# Patient Record
Sex: Female | Born: 1970 | Race: Black or African American | Hispanic: No | Marital: Single | Smoking: Current every day smoker
Health system: Southern US, Community
[De-identification: ages and names within clinical notes are randomized; demographics above are authoritative.]

## PROBLEM LIST (undated history)

## (undated) ENCOUNTER — Inpatient Hospital Stay (HOSPITAL_COMMUNITY): Admission: EM | Payer: PRIVATE HEALTH INSURANCE | Source: Home / Self Care

## (undated) DIAGNOSIS — D649 Anemia, unspecified: Secondary | ICD-10-CM

## (undated) DIAGNOSIS — S0590XA Unspecified injury of unspecified eye and orbit, initial encounter: Secondary | ICD-10-CM

## (undated) DIAGNOSIS — R519 Headache, unspecified: Secondary | ICD-10-CM

## (undated) DIAGNOSIS — Z8742 Personal history of other diseases of the female genital tract: Secondary | ICD-10-CM

## (undated) DIAGNOSIS — D219 Benign neoplasm of connective and other soft tissue, unspecified: Secondary | ICD-10-CM

## (undated) DIAGNOSIS — R51 Headache: Secondary | ICD-10-CM

## (undated) HISTORY — PX: WISDOM TOOTH EXTRACTION: SHX21

---

## 2002-05-29 ENCOUNTER — Emergency Department (HOSPITAL_COMMUNITY): Admission: EM | Admit: 2002-05-29 | Discharge: 2002-05-29 | Payer: Self-pay | Admitting: Emergency Medicine

## 2002-05-31 ENCOUNTER — Encounter: Payer: Self-pay | Admitting: General Practice

## 2002-05-31 ENCOUNTER — Encounter: Admission: RE | Admit: 2002-05-31 | Discharge: 2002-05-31 | Payer: Self-pay | Admitting: General Practice

## 2002-06-06 ENCOUNTER — Encounter: Admission: RE | Admit: 2002-06-06 | Discharge: 2002-08-15 | Payer: Self-pay | Admitting: General Practice

## 2008-04-22 ENCOUNTER — Emergency Department (HOSPITAL_COMMUNITY): Admission: EM | Admit: 2008-04-22 | Discharge: 2008-04-22 | Payer: Self-pay | Admitting: Emergency Medicine

## 2008-04-24 ENCOUNTER — Emergency Department (HOSPITAL_COMMUNITY): Admission: EM | Admit: 2008-04-24 | Discharge: 2008-04-24 | Payer: Self-pay | Admitting: Family Medicine

## 2008-05-03 ENCOUNTER — Emergency Department (HOSPITAL_COMMUNITY): Admission: EM | Admit: 2008-05-03 | Discharge: 2008-05-03 | Payer: Self-pay | Admitting: Family Medicine

## 2008-07-16 ENCOUNTER — Inpatient Hospital Stay (HOSPITAL_COMMUNITY): Admission: EM | Admit: 2008-07-16 | Discharge: 2008-07-19 | Payer: Self-pay | Admitting: Emergency Medicine

## 2008-07-17 ENCOUNTER — Encounter (INDEPENDENT_AMBULATORY_CARE_PROVIDER_SITE_OTHER): Payer: Self-pay | Admitting: Neurology

## 2008-08-23 ENCOUNTER — Ambulatory Visit: Payer: Self-pay | Admitting: Internal Medicine

## 2008-11-14 ENCOUNTER — Encounter: Admission: RE | Admit: 2008-11-14 | Discharge: 2009-02-12 | Payer: Self-pay | Admitting: Internal Medicine

## 2009-01-02 ENCOUNTER — Emergency Department (HOSPITAL_COMMUNITY): Admission: EM | Admit: 2009-01-02 | Discharge: 2009-01-02 | Payer: Self-pay | Admitting: Emergency Medicine

## 2009-01-03 ENCOUNTER — Encounter: Admission: RE | Admit: 2009-01-03 | Discharge: 2009-01-03 | Payer: Self-pay | Admitting: Chiropractic Medicine

## 2009-02-09 ENCOUNTER — Emergency Department (HOSPITAL_COMMUNITY): Admission: EM | Admit: 2009-02-09 | Discharge: 2009-02-09 | Payer: Self-pay | Admitting: Emergency Medicine

## 2009-11-14 ENCOUNTER — Emergency Department (HOSPITAL_COMMUNITY): Admission: EM | Admit: 2009-11-14 | Discharge: 2009-11-14 | Payer: Self-pay | Admitting: Family Medicine

## 2010-01-10 ENCOUNTER — Emergency Department (HOSPITAL_COMMUNITY): Admission: EM | Admit: 2010-01-10 | Discharge: 2010-01-10 | Payer: Self-pay | Admitting: Family Medicine

## 2010-01-10 ENCOUNTER — Emergency Department (HOSPITAL_COMMUNITY): Admission: EM | Admit: 2010-01-10 | Discharge: 2010-01-11 | Payer: Self-pay | Admitting: Emergency Medicine

## 2010-04-16 ENCOUNTER — Other Ambulatory Visit
Admission: RE | Admit: 2010-04-16 | Discharge: 2010-04-16 | Payer: Self-pay | Source: Home / Self Care | Admitting: Obstetrics and Gynecology

## 2010-04-16 ENCOUNTER — Ambulatory Visit: Payer: Self-pay | Admitting: Obstetrics and Gynecology

## 2010-04-16 LAB — CONVERTED CEMR LAB
MCV: 63.5 fL — ABNORMAL LOW (ref 78.0–100.0)
Platelets: 1009 10*3/uL — ABNORMAL HIGH (ref 150–400)
TSH: 0.308 microintl units/mL — ABNORMAL LOW (ref 0.350–4.500)
WBC: 6.3 10*3/uL (ref 4.0–10.5)

## 2010-04-28 ENCOUNTER — Ambulatory Visit: Payer: Self-pay | Admitting: Obstetrics & Gynecology

## 2010-04-28 ENCOUNTER — Encounter: Payer: Self-pay | Admitting: Obstetrics & Gynecology

## 2010-04-28 LAB — CONVERTED CEMR LAB: T3, Free: 3.3 pg/mL (ref 2.3–4.2)

## 2010-05-07 ENCOUNTER — Ambulatory Visit: Payer: Self-pay | Admitting: Obstetrics and Gynecology

## 2010-05-21 ENCOUNTER — Ambulatory Visit: Payer: Self-pay | Admitting: Obstetrics and Gynecology

## 2010-07-10 ENCOUNTER — Ambulatory Visit: Payer: Self-pay | Admitting: Family Medicine

## 2010-07-16 ENCOUNTER — Other Ambulatory Visit: Payer: Self-pay | Admitting: Family Medicine

## 2010-07-16 ENCOUNTER — Ambulatory Visit: Payer: PRIVATE HEALTH INSURANCE | Admitting: Obstetrics and Gynecology

## 2010-07-16 ENCOUNTER — Encounter: Payer: Self-pay | Admitting: Obstetrics and Gynecology

## 2010-07-16 DIAGNOSIS — N938 Other specified abnormal uterine and vaginal bleeding: Secondary | ICD-10-CM

## 2010-07-16 DIAGNOSIS — N949 Unspecified condition associated with female genital organs and menstrual cycle: Secondary | ICD-10-CM

## 2010-07-16 DIAGNOSIS — Z1231 Encounter for screening mammogram for malignant neoplasm of breast: Secondary | ICD-10-CM

## 2010-07-16 LAB — CONVERTED CEMR LAB
MCV: 60.1 fL — ABNORMAL LOW (ref 78.0–100.0)
Platelets: 492 10*3/uL — ABNORMAL HIGH (ref 150–400)
WBC: 4.1 10*3/uL (ref 4.0–10.5)

## 2010-07-23 ENCOUNTER — Ambulatory Visit (HOSPITAL_COMMUNITY)
Admission: RE | Admit: 2010-07-23 | Discharge: 2010-07-23 | Disposition: A | Discharge status: Home / Self Care | Payer: PRIVATE HEALTH INSURANCE | Source: Ambulatory Visit | Attending: Family Medicine | Admitting: Family Medicine

## 2010-07-23 ENCOUNTER — Other Ambulatory Visit: Payer: Self-pay | Admitting: Family Medicine

## 2010-07-23 DIAGNOSIS — Z1231 Encounter for screening mammogram for malignant neoplasm of breast: Secondary | ICD-10-CM | POA: Insufficient documentation

## 2010-07-23 DIAGNOSIS — J40 Bronchitis, not specified as acute or chronic: Secondary | ICD-10-CM

## 2010-07-23 DIAGNOSIS — I517 Cardiomegaly: Secondary | ICD-10-CM | POA: Insufficient documentation

## 2010-07-23 DIAGNOSIS — R059 Cough, unspecified: Secondary | ICD-10-CM | POA: Insufficient documentation

## 2010-07-23 DIAGNOSIS — J209 Acute bronchitis, unspecified: Secondary | ICD-10-CM | POA: Insufficient documentation

## 2010-07-23 DIAGNOSIS — R05 Cough: Secondary | ICD-10-CM | POA: Insufficient documentation

## 2010-07-23 DIAGNOSIS — F17200 Nicotine dependence, unspecified, uncomplicated: Secondary | ICD-10-CM | POA: Insufficient documentation

## 2010-07-30 ENCOUNTER — Ambulatory Visit: Payer: PRIVATE HEALTH INSURANCE | Admitting: Obstetrics and Gynecology

## 2010-07-30 ENCOUNTER — Encounter: Payer: Self-pay | Admitting: Obstetrics and Gynecology

## 2010-07-30 DIAGNOSIS — N949 Unspecified condition associated with female genital organs and menstrual cycle: Secondary | ICD-10-CM

## 2010-07-30 LAB — CONVERTED CEMR LAB
HCT: 21.5 % — ABNORMAL LOW (ref 36.0–46.0)
MCHC: 26.5 g/dL — ABNORMAL LOW (ref 30.0–36.0)
MCV: 59.7 fL — ABNORMAL LOW (ref 78.0–100.0)
Platelets: 527 10*3/uL — ABNORMAL HIGH (ref 150–400)
RDW: 25.7 % — ABNORMAL HIGH (ref 11.5–15.5)

## 2010-08-02 ENCOUNTER — Observation Stay (HOSPITAL_COMMUNITY)
Admission: AD | Admit: 2010-08-02 | Discharge: 2010-08-03 | Disposition: A | Discharge status: Home / Self Care | Payer: PRIVATE HEALTH INSURANCE | Source: Ambulatory Visit | Attending: Family Medicine | Admitting: Family Medicine

## 2010-08-02 DIAGNOSIS — D62 Acute posthemorrhagic anemia: Principal | ICD-10-CM | POA: Insufficient documentation

## 2010-08-02 DIAGNOSIS — D259 Leiomyoma of uterus, unspecified: Secondary | ICD-10-CM | POA: Insufficient documentation

## 2010-08-02 DIAGNOSIS — N920 Excessive and frequent menstruation with regular cycle: Secondary | ICD-10-CM | POA: Insufficient documentation

## 2010-08-03 LAB — CBC
HCT: 29.2 % — ABNORMAL LOW (ref 36.0–46.0)
Hemoglobin: 8.5 g/dL — ABNORMAL LOW (ref 12.0–15.0)
MCH: 19.3 pg — ABNORMAL LOW (ref 26.0–34.0)
MCV: 66.2 fL — ABNORMAL LOW (ref 78.0–100.0)
RBC: 4.41 MIL/uL (ref 3.87–5.11)

## 2010-08-04 LAB — CROSSMATCH
ABO/RH(D): B POS
Unit division: 0

## 2010-08-14 ENCOUNTER — Encounter: Payer: Self-pay | Admitting: Obstetrics and Gynecology

## 2010-08-14 ENCOUNTER — Ambulatory Visit: Payer: PRIVATE HEALTH INSURANCE | Admitting: Obstetrics and Gynecology

## 2010-08-14 DIAGNOSIS — N949 Unspecified condition associated with female genital organs and menstrual cycle: Secondary | ICD-10-CM

## 2010-08-14 DIAGNOSIS — D649 Anemia, unspecified: Secondary | ICD-10-CM

## 2010-08-14 LAB — CONVERTED CEMR LAB
HCT: 31.7 % — ABNORMAL LOW (ref 36.0–46.0)
Hemoglobin: 9.2 g/dL — ABNORMAL LOW (ref 12.0–15.0)
MCHC: 29 g/dL — ABNORMAL LOW (ref 30.0–36.0)
MCV: 66.6 fL — ABNORMAL LOW (ref 78.0–100.0)
RBC: 4.76 M/uL (ref 3.87–5.11)
WBC: 4.8 10*3/uL (ref 4.0–10.5)

## 2010-08-15 ENCOUNTER — Ambulatory Visit: Payer: PRIVATE HEALTH INSURANCE | Admitting: Obstetrics and Gynecology

## 2010-08-15 DIAGNOSIS — N949 Unspecified condition associated with female genital organs and menstrual cycle: Secondary | ICD-10-CM

## 2010-08-15 DIAGNOSIS — D509 Iron deficiency anemia, unspecified: Secondary | ICD-10-CM

## 2010-08-15 DIAGNOSIS — D259 Leiomyoma of uterus, unspecified: Secondary | ICD-10-CM

## 2010-08-15 LAB — CBC
HCT: 26.6 % — ABNORMAL LOW (ref 36.0–46.0)
Hemoglobin: 7.5 g/dL — ABNORMAL LOW (ref 12.0–15.0)
RDW: 24.6 % — ABNORMAL HIGH (ref 11.5–15.5)
WBC: 3.9 10*3/uL — ABNORMAL LOW (ref 4.0–10.5)

## 2010-08-15 LAB — DIFFERENTIAL
Basophils Relative: 0 % (ref 0–1)
Eosinophils Absolute: 0.1 10*3/uL (ref 0.0–0.7)
Eosinophils Relative: 3 % (ref 0–5)
Lymphocytes Relative: 42 % (ref 12–46)
Neutrophils Relative %: 44 % (ref 43–77)

## 2010-08-15 LAB — POCT URINALYSIS DIPSTICK
Nitrite: NEGATIVE
Protein, ur: 100 mg/dL — AB
pH: 5 (ref 5.0–8.0)

## 2010-08-15 LAB — WET PREP, GENITAL: Clue Cells Wet Prep HPF POC: NONE SEEN

## 2010-08-17 LAB — WET PREP, GENITAL
Clue Cells Wet Prep HPF POC: NONE SEEN
Trich, Wet Prep: NONE SEEN
WBC, Wet Prep HPF POC: NONE SEEN
Yeast Wet Prep HPF POC: NONE SEEN

## 2010-08-17 LAB — GC/CHLAMYDIA PROBE AMP, GENITAL
Chlamydia, DNA Probe: NEGATIVE
GC Probe Amp, Genital: NEGATIVE

## 2010-08-17 LAB — POCT PREGNANCY, URINE: Preg Test, Ur: NEGATIVE

## 2010-08-21 ENCOUNTER — Other Ambulatory Visit: Payer: Self-pay | Admitting: Obstetrics and Gynecology

## 2010-08-21 DIAGNOSIS — D219 Benign neoplasm of connective and other soft tissue, unspecified: Secondary | ICD-10-CM

## 2010-08-22 NOTE — Progress Notes (Unsigned)
NAME:  [name], [name] NO.:  [id number]  MEDICAL RECORD NO.:  [id number]           PATIENT TYPE:  A  LOCATION:  [facility]                   FACILITY:  [facility]  PHYSICIAN:  Argentina Donovan, MD        DATE OF BIRTH:  Oct 07, 1970  DATE OF SERVICE:  08/14/2010                                 CLINIC NOTE  The patient is a 40 year old African American nulligravida female, who was seen in November with a hemoglobin of 5.4, hematocrit of 20, placed on Depo-Provera cut down somewhat on her bleeding, but still she continued to bleed.  I saw her then a few weeks ago.  On February 29, her hemoglobin at that time was still only 5.7, so we had her admitted and gave her 2 units of blood which brought her hemoglobin up at 8.5. She has uterine fibroids.  She was not really anxious for surgery.  We have talked about endometrial ablation as well as myomectomy and I am going to have her first see an interventional radiologist prior to seeing one of our surgeons.  She also has a problem with headaches, especially on the left side and nasal congestion on that side.  The headaches have a character of migraine with the trigger points in the neck.  However, with the constant nasal congestion on the left, it certainly cannot rule out any ENT problem that may be aggravating this condition, so once she sees an interventional radiologist, comes in for a surgical consult, then I think that they referred to an ENT specialist.  She had been on Plavix in the past, but now stopped that because of an allergy.  IMPRESSION:  Dysfunctional uterine bleeding secondary to leiomyomata uteri.  PLAN:  Uterine artery embolization versus endometrial ablation versus myomectomy.          ______________________________ Argentina Donovan, MD    PR/MEDQ  D:  08/14/2010  T:  08/15/2010  Job:  [id number]

## 2010-08-22 NOTE — Progress Notes (Unsigned)
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  She was seen in November with hemoglobin of 5.4, hematocrit of 20, and dysfunctional uterine bleeding.  We started on Depo-Provera, which controlled the bleeding after benign endometrial biopsy.  She returns today, the bleeding has restarted and give her another shot. Get a CBC on her.  She has decreased breath sounds and a productive cough for week and she has some rhonchi in the upper part of the chest and decreased breath sounds.  I am putting her on Medrol Dosepak with Keflex.  Bring her back in a week and see whether or not the bleeding is controlled and hopefully, the chest is clear.  IMPRESSION:  Probably acute bronchitis to rule out pneumonia; dysfunctional uterine bleeding secondary to submucous fibroid.          ______________________________ Argentina Donovan, MD    PR/MEDQ  D:  07/16/2010  T:  07/17/2010  Job:  [id number]

## 2010-08-22 NOTE — Progress Notes (Signed)
NAME:  [name], [name] NO.:  [id number]  MEDICAL RECORD NO.:  [id number]           PATIENT TYPE:  A  LOCATION:  [facility]                   FACILITY:  [facility]  PHYSICIAN:  Argentina Donovan, MD        DATE OF BIRTH:  07-04-1970  DATE OF SERVICE:  07/30/2010                                 CLINIC NOTE  The patient is a 40 year old nulligravida African American female who was seen originally for dysfunctional uterine bleeding, has several large fibroids, largest being around 5 cm and she bled down to a hemoglobin of 5.4.  When we saw her last time, she had been on Plavix because couple of years ago she did have a CVA that started causing skin problems, so her doctor stopped that immediately.  The Depo-Provera seems to have worked since the bleeding has stopped.  She had a hemoglobin of 5.4, last time we saw her it was 5.8.  It has been now 2 weeks since we saw her.  We are going to check her hemoglobin one more time.  If it has not gone up sufficiently in the short time, I think that we got to decide what to do, myomectomy following transfusion or endometrial ablation.  I know that she has had no children, so this is not something she would really like to do, but with a history of CVA, not now being on an anticoagulant, is concerning I think.  The impression is dysfunctional uterine bleeding with uterine fibroids and severe anemia.  I am going to bring her back in a month if she still stays amenorrhea and give her a second shot of Depo-Provera.  I am afraid to wait full 3 months and then give [place] a chance to recover the hemoglobin naturally and that way, hopefully, we can make some decision about treatment.  IMPRESSION:  Uterine fibroids with secondary dysfunctional uterine bleeding and severe anemia.          ______________________________ Argentina Donovan, MD    PR/MEDQ  D:  07/30/2010  T:  07/31/2010  Job:  [id number]

## 2010-08-27 ENCOUNTER — Other Ambulatory Visit: Payer: Self-pay | Admitting: Obstetrics and Gynecology

## 2010-08-27 ENCOUNTER — Ambulatory Visit
Admission: RE | Admit: 2010-08-27 | Discharge: 2010-08-27 | Disposition: A | Discharge status: Home / Self Care | Payer: PRIVATE HEALTH INSURANCE | Source: Ambulatory Visit | Attending: Obstetrics and Gynecology | Admitting: Obstetrics and Gynecology

## 2010-08-27 VITALS — BP 140/86 | HR 66 | Temp 99.0°F | Resp 16 | Ht 63.5 in | Wt 170.0 lb

## 2010-08-27 DIAGNOSIS — D219 Benign neoplasm of connective and other soft tissue, unspecified: Secondary | ICD-10-CM

## 2010-08-27 NOTE — Progress Notes (Signed)
Gravida 0 Pt reports that she has been experiencing menstrual type bleeding since 12/2009.  Prior to Rx  With Depo Provera also experienced heavy menstrual flow w/ clots & dysmenorrhea.  Bulk Sx:  Bloating; pelvic pressure; urinary frequency; back discomfort.    Anemia:  Hgb 5.4, Nov 2011;  Hgb 5.7, Jul 30, 2010.  Pt was admitted as IP for 2 units bld.  Hgb inc'd to 8.5.  Last checked, 08/14/2010:  Hgb 9.2.    Pt states that she has experienced a low grade fever since bld transfusion on 07/30/2010.

## 2010-08-28 ENCOUNTER — Ambulatory Visit
Admission: RE | Admit: 2010-08-28 | Discharge: 2010-08-28 | Disposition: A | Discharge status: Home / Self Care | Payer: PRIVATE HEALTH INSURANCE | Source: Ambulatory Visit | Attending: Obstetrics and Gynecology | Admitting: Obstetrics and Gynecology

## 2010-08-28 DIAGNOSIS — D219 Benign neoplasm of connective and other soft tissue, unspecified: Secondary | ICD-10-CM

## 2010-09-04 ENCOUNTER — Other Ambulatory Visit (HOSPITAL_COMMUNITY): Payer: Self-pay | Admitting: Interventional Radiology

## 2010-09-05 LAB — CULTURE, ROUTINE-ABSCESS

## 2010-09-16 LAB — FOLATE: Folate: 17.5 ng/mL

## 2010-09-16 LAB — COMPREHENSIVE METABOLIC PANEL
AST: 18 U/L (ref 0–37)
Albumin: 3.2 g/dL — ABNORMAL LOW (ref 3.5–5.2)
Alkaline Phosphatase: 52 U/L (ref 39–117)
BUN: 9 mg/dL (ref 6–23)
CO2: 23 mEq/L (ref 19–32)
Chloride: 107 mEq/L (ref 96–112)
GFR calc Af Amer: >60 mL/min (ref >60)
GFR calc non Af Amer: >60 mL/min (ref >60)
Potassium: 4.1 mEq/L (ref 3.5–5.1)
Total Bilirubin: 0.3 mg/dL (ref 0.3–1.2)

## 2010-09-16 LAB — URINALYSIS, ROUTINE W REFLEX MICROSCOPIC
Bilirubin Urine: NEGATIVE
Glucose, UA: NEGATIVE mg/dL
Hgb urine dipstick: NEGATIVE
Ketones, ur: NEGATIVE mg/dL
Protein, ur: NEGATIVE mg/dL
Urobilinogen, UA: 0.2 mg/dL (ref 0.0–1.0)

## 2010-09-16 LAB — LIPID PANEL
Cholesterol: 116 mg/dL (ref 0–200)
HDL: 39 mg/dL — ABNORMAL LOW (ref >39)
LDL Cholesterol: 68 mg/dL (ref 0–99)
Total CHOL/HDL Ratio: 3 RATIO
Triglycerides: 47 mg/dL (ref <150)

## 2010-09-16 LAB — CBC
HCT: 27.1 % — ABNORMAL LOW (ref 36.0–46.0)
MCHC: 30.5 g/dL (ref 30.0–36.0)
MCHC: 30.8 g/dL (ref 30.0–36.0)
MCV: 60.4 fL — ABNORMAL LOW (ref 78.0–100.0)
MCV: 62.8 fL — ABNORMAL LOW (ref 78.0–100.0)
Platelets: 413 10*3/uL — ABNORMAL HIGH (ref 150–400)
Platelets: 293 10*3/uL (ref 150–400)
Platelets: 314 10*3/uL (ref 150–400)
Platelets: 362 10*3/uL (ref 150–400)
RBC: 4.42 MIL/uL (ref 3.87–5.11)
RDW: 26.9 % — ABNORMAL HIGH (ref 11.5–15.5)
WBC: 8.1 10*3/uL (ref 4.0–10.5)
WBC: 6.4 10*3/uL (ref 4.0–10.5)

## 2010-09-16 LAB — GLUCOSE, CAPILLARY: Glucose-Capillary: 92 mg/dL (ref 70–99)

## 2010-09-16 LAB — PROTIME-INR: INR: 1.2 (ref 0.00–1.49)

## 2010-09-16 LAB — DIFFERENTIAL
Basophils Relative: 2 % — ABNORMAL HIGH (ref 0–1)
Eosinophils Absolute: 0.1 10*3/uL (ref 0.0–0.7)
Monocytes Absolute: 0.6 10*3/uL (ref 0.1–1.0)
Neutro Abs: 4.9 10*3/uL (ref 1.7–7.7)

## 2010-09-16 LAB — HEPARIN LEVEL (UNFRACTIONATED)
Heparin Unfractionated: 0.31 IU/mL (ref 0.30–0.70)
Heparin Unfractionated: 0.76 IU/mL — ABNORMAL HIGH (ref 0.30–0.70)
Heparin Unfractionated: 0.79 IU/mL — ABNORMAL HIGH (ref 0.30–0.70)

## 2010-09-16 LAB — COMPLEMENT, TOTAL: Compl, Total (CH50): 56 U/mL (ref 31–66)

## 2010-09-16 LAB — VITAMIN B12: Vitamin B-12: 571 pg/mL (ref 211–911)

## 2010-09-16 LAB — C4 COMPLEMENT: Complement C4, Body Fluid: 23 mg/dL (ref 16–47)

## 2010-09-16 LAB — URINE MICROSCOPIC-ADD ON

## 2010-09-16 LAB — SEDIMENTATION RATE: Sed Rate: 15 mm/hr (ref 0–22)

## 2010-09-18 NOTE — Discharge Summary (Signed)
  NAMEJOLYNDA, [name]              ACCOUNT NO.:  [id number]  MEDICAL RECORD NO.:  [id number]           PATIENT TYPE:  LOCATION:                                 FACILITY:  PHYSICIAN:  Catalina Antigua, MD     DATE OF BIRTH:  [birth year]  DATE OF ADMISSION: DATE OF DISCHARGE:                              DISCHARGE SUMMARY   This is a 40 year old gravida 0 with known fibroid and menorrhagia who was admitted on March 3 secondary to symptomatic anemia. The patient has a known history of fibroid and menorrhagia, currently medically managed with Depo-Provera.  The patient was admitted on March 3 secondary to a hemoglobin of 5.4.  The patient's vital signs were otherwise stable and blood pressure was of 112/74, pulse of 78.  However the patient looked pale and had a 2/6 systolic ejection murmur.  Physical examination.  She had an approximately 8-week size retroverted uterus.  Plan was for admission for blood transfusion.  The patient received 4 units of packed red blood cells.  Following the completion of the transfusion, her hemoglobin was found to be 8.5.  The patient reported feeling significantly better.  The patient was deemed to be stable for discharge with plans for followup at the [facility].  The patient was advised to continue the use of iron.     Catalina Antigua, MD     PC/MEDQ  D:  09/16/2010  T:  09/16/2010  Job:  [id number]  Electronically Signed by Catalina Antigua  on 09/18/2010 02:37:46 PM

## 2010-10-14 NOTE — Consult Note (Signed)
[name], [name] NO.:  [id number]   MEDICAL RECORD NO.:  [id number]          PATIENT TYPE:  INP   LOCATION:  1826                         FACILITY:  [facility]   PHYSICIAN:  Noel Christmas, MD    DATE OF BIRTH:  Jun 17, 1970   DATE OF CONSULTATION:  07/16/2008  DATE OF DISCHARGE:                                 CONSULTATION   REFERRING PHYSICIAN:  Bethann Berkshire, MD.   REASON FOR CONSULTATION:  Blurred vision involving left eye as well as  headache for 5 to 6 days.   HISTORY OF PRESENT ILLNESS:  This is a 41 year old lady who presented  with a history of blurring of vision involving her left eye along with  headache involving left side of her head, left side of her neck, and  left shoulder for 5 to 6 days.  With no improvement in vision, she  decided to seek medical attention.  Initial evaluation showed markedly  reduced visual acuity involving her left eye.  There has been no real  change since the onset of her symptoms.  Symptoms started during a spell  of coughing.  She has had some numbness in both feet, but no other focal  neurological symptoms.  CT of her head without contrast showed no acute  intracranial abnormality.  The patient has no known systematic medical  disease.  Sedimentation rate was 12.  Serum glucose was 92.  Blood  pressure was 127/75.  The patient has been on no medications.  She was  seen by ophthalmologist prior to my evaluation.  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  Etiology is unclear.   PAST MEDICAL HISTORY:  As indicated is negative for systematic medical  disease.  There is a history of cannabis use.  The patient has been a  cigarette smoker at times.   MEDICATIONS:  None.   FAMILY HISTORY:  Positive for stroke and diabetes mellitus involving her  mother.  Family history is otherwise noncontributory.   PHYSICAL EXAMINATION:  VITAL SIGNS:  Temperature was 98.1, pulse was 59  per  minute and regular, respirations were 14 per minute, and blood  pressure as indicated above was 127/75.  GENERAL:  The patient was alert and oriented x3.  Her short-term and  long-term memory were normal.  Affect was appropriate.  She had no  receptive, no expressive language problem.  HEENT:  Pupils were completely dilated from previous ophthalmological  exam.  Extraocular movements were full and conjugate.  Visual fields  were intact and normal.  Right fundus appeared normal.  Left fundus  showed mild paleness of her optic nerve as well as slight blurring of  optic disk edge.  There was no evidence of retinal artery branch  occlusion nor obvious reduction in caliber of retinal arteries.  There  was no venous distention and the patient had no retinal hemorrhages.  There was no facial numbness, no facial weakness.  Hearing was normal.  Speech and palatal movement were normal.  Strength and muscle tone were  normal throughout.  Deep tendon reflexes were normal  and symmetrical.  Plantar responses were flexor.  Sensory examination was normal.  NECK:  The patient had no carotid bruits on either side.   CLINICAL IMPRESSION:  Acute ischemic event involving left eye secondary  to retinal artery insufficiency, occlusion of possible spasm.  Etiology  is unclear.  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  However, embolus source will need  to be ruled out, either carotid or cardiac.  Vasculitis is less likely  given sedimentation rate of 12.  Vasculitis cannot be completely ruled  out at this point, however.  Coagulopathy will also need to be ruled  out.   RECOMMENDATIONS:  1. PT and PTT if not already ordered.  2. CT angiogram of the head and neck with contrast.  3. The patient may will need catheter cerebral angiogram as well.  4. TEE to rule out embolus source.  5. Workup to rule out collagen vascular disease/vasculitis.  6. Workup to  rule out coagulopathy.  7. Aspirin 325 mg per day unless CT angiogram shows indication of      significant occlusive disease or carotid artery dissection, which      case the patient will need anticoagulation.  8. Analgesic as needed for headache management as well as muscle      relaxants such as Robaxin 500 mg IV q.6 h. p.r.n.      Noel Christmas, MD  Electronically Signed     CS/MEDQ  D:  07/16/2008  T:  07/16/2008  Job:  [id number]

## 2010-10-14 NOTE — H&P (Signed)
[name], [name] NO.:  [id number]   MEDICAL RECORD NO.:  [id number]          PATIENT TYPE:  INP   LOCATION:                               FACILITY:  [facility]   PHYSICIAN:  Noel Christmas, MD    DATE OF BIRTH:  01-14-1971   DATE OF ADMISSION:  07/16/2008  DATE OF DISCHARGE:                              HISTORY & PHYSICAL   CHIEF COMPLAINT:  Blurred vision for 5 to 6 days involving the left eye.   HISTORY OF PRESENT ILLNESS:  This is a 40 year old African American lady  who presented with a history of acute onset of blurred vision 5 to 6  days earlier which occurred during a spell of coughing.  She also  experienced the onset of headache at the same time, involving the left  side of her head.  She states she has also developed pain on the left  side of her neck as well as pain on the left shoulder.  Pain has been  excruciating at times and she has had some intermittent nausea.  She did  not seek medical attention until tonight.  She has no known medical  illness and has been on no medications.  Ophthalmology consultant  evaluated her earlier and determined that she had central retinal artery  ischemic changes of unclear etiology.  Carotid dissection was  consideration, given her history of onset of symptoms with exertion.  The patient had no other focal symptoms other than tingling involving  her feet.  CT scan of her head showed no acute intracranial abnormality.  Sedimentation rate was 12.  Serum glucose was 92, and the patient has no  history of diabetes mellitus.   PAST MEDICAL HISTORY:  Fairly unremarkable.  She has no known systemic  medical disease.  There is some history of cannabis use and cigarette  smoking.   MEDICATIONS:  None.   ALLERGIES:  None.   FAMILY HISTORY:  Positive for stroke and diabetes mellitus involving her  mother.  Otherwise, family history is noncontributory.   SOCIAL HISTORY:  The patient is divorced and lives with fiance.   She has  no children.  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  She does not drink alcohol.  She  has had some history of cigarette smoking.  As indicated above, there is  some history of cannabis use as well.   REVIEW OF SYSTEMS:  Negative except for presenting illness.   PHYSICAL EXAMINATION:  VITAL SIGNS:  Temperature was 98.1, pulse 59 per  minute, respirations 14 per minute, blood pressure was 127/75, and  oxygen saturation on room air was 99%.  GENERAL:  Appearance was that of slightly overweight young to early-  middle-aged lady who was alert and cooperative, and in no acute  distress.  She was well oriented to time as well as place.  Short-term  and long-term memory were normal.  Affect was appropriate.  HEAD, EYES, EARS, NOSE, and THROAT:  Normal except both pupils were  completely dilated from previous ophthalmology evaluation  and neither  reacted to light.  NECK:  Supple except for mild tenderness involving the left side of her  neck.  CHEST:  Clear to auscultation.  CARDIAC:  Rate and rhythm were normal.  Heart sounds were normal.  ABDOMEN:  Soft and flat.  Bowel sounds were normal.  There were no  masses or tenderness.  GENITALIA:  Deferred.  RECTUM:  Deferred.  EXTREMITIES:  Normal.  Peripheral pulses were normal.  NEUROLOGIC:  Pupils were dilated and unreacted to light.  Extraocular  movements were full and conjugate.  Visual fields were intact and  normal.  Right fundus appeared normal.  Left fundus showed paleness of  the optic nerve as well as mild blurring of the disk edge on the left.  There was no arterial narrowing nor any signs of retinal artery branch  occlusion.  There was no venous distention and there were also no  retinal hemorrhages.  She had no facial numbness.  No facial weakness.  Hearing was normal.  Speech and palatal movements were normal.  Motor  exam showed normal strength and normal  tone throughout.  Coordination  was normal.  Deep tendon reflexes were normal and symmetrical  throughout.  Plantar responses were flexor.  Sensory examination was  normal.  The patient had no carotid bruit on either side.   CT angiogram showed occlusion of the left internal carotid artery,  consistent with probable carotid artery dissection, consistent with  probable dissection, given her history as described above.  Circle of  Willis and cerebral vasculature were normal.   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  The patient has no  clinical signs nor any indications on CTA and CT of her head a  cerebrovascular ischemic event.   PLAN:  1. We will complete workup with obtaining PT and PTT as well as CBC      and CMP.  2. The patient will likely need anticoagulation with heparin initially      and long-term anticoagulation with Coumadin.  3. We will have Vascular Surgery evaluate the patient's images and to      determine if intervention is indicated.  4. Symptomatic management of headache with morphine sulfate 2 mg every      3 hours as well as Robaxin 500 mg every 6 hours p.r.n. headache or      neck pain.  5. Workup to rule out underlying coagulopathy.      Noel Christmas, MD  Electronically Signed     CS/MEDQ  D:  07/16/2008  T:  07/16/2008  Job:  [id number]

## 2010-10-14 NOTE — Discharge Summary (Signed)
[name], [name]              ACCOUNT NO.:  [id number]   MEDICAL RECORD NO.:  [id number]          PATIENT TYPE:  INP   LOCATION:  2030                         FACILITY:  [facility]   PHYSICIAN:  Pramod P. 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   DISCHARGE MEDICATIONS:  1. Aspirin 81 mg a day.  2. Plavix 75 mg a day.   STUDIES PERFORMED:  1. CT of the brain on admission shows no acute abnormality.  2. Chest x-ray shows heart size upper limits of normal.  3. CT angio of the head normal.  4. CT angio of the neck shows occlusion of the left internal carotid      artery with intraluminal filling defect noted, likely due to      dissection.  5. Cerebral angiogram shows occluded left internal carotid artery at      the bulb without distal reconstitution of the intracranial internal      carotid artery from the external carotid artery branches, partial      attempt at reconstitution of the ipsilateral ophthalmic artery from      the nasoethmoidal branches with a partial ciliary brush,      nonopacification of the left ACA A1 segment.  6. EKG shows normal sinus rhythm.   LABORATORY STUDIES:  PT 1.1 and INR 1.4 on July 18, 2008 after  Coumadin.  Folic acid 17.5.  Homocystine 8.7.  C4 23 and C3 102.  Sed  rate 12.  Vitamin B12 571.  Hemoglobin A1c 5.8.  Cholesterol 116,  triglycerides 47, HDL 39, and LDL 68.   HISTORY OF PRESENT ILLNESS:  [name] is a 40 year old right-  handed African American female who presents with history of acute onset  of blurred vision for 5-6 days which occurred during the spell of  coughing.  She also experienced a headache at that time involving the  left side of her head.  She states she has also developed  pain into the  left side of her neck as well as pain in her left shoulder.  Pain has  been excruciating at times and she has had some intermittent nausea.  She did not seek medical attention until the night of July 16, 2008.  She has no known significant medical history.  Ophthalmology had been  consulted earlier in the evening and determined she has central retinal  artery occlusion of unclear etiology.  Carotid dissection was considered  given her history of acute onset of symptoms with exertion.  She had no  other focal symptoms.  Sed rate was 12 and glucose was 92.  CT head  showed no acute abnormalities.  She was admitted to the hospital for  workup.  She was placed on IV heparin and Coumadin.  We will manage her  symptoms of headache.   HOSPITAL COURSE:  Subacute left ICA occlusion  secondary to dissection  from cough, placed on IV heparin and Coumadin initially, but cannot  maintain Coumadin as she has no primary care physician and no one to  follow up lab values and adjust Coumadin dose.  We will place on aspirin  and Plavix until for the next 2 months.  We will plan CT angio in 2  months to reassess dissection and make plans from there.  In the  meanwhile, we will get her establish with [facility] as her primary  care physician.  Plavix has been applied forth of the medicine  assistance program and that will be shipped to Dr. Delia Heady for the  next 2 months.  The patient has no neurologic deficit other than  decreased visual field from her central retinal artery occlusion and she  is safe for discharge home.  She will follow up with Dr. Pearlean Brownie in 2  months.   CONDITION ON DISCHARGE:  The patient is alert and oriented x3.  No  aphasia.  No dysarthria.  Eye movements are full.  Face is symmetric.  No extremity weakness.  She has decreased visual field in her left eye.   DISCHARGE PLAN:  1. Discharge home.  2. Aspirin and Plavix x2 months secondary to dissection.  3.  Follow up [facility] and get established there to a primary care      physician.  4. Follow up Dr. Delia Heady in 2 months.  5. Individual medicine assistance program for Plavix will be sent to      Dr. Delia Heady.      Annie Main, N.P.    ______________________________  Sunny Schlein. Pearlean Brownie, MD    SB/MEDQ  D:  07/19/2008  T:  07/19/2008  Job:  [id number]   cc:   [facility]

## 2011-03-03 LAB — CULTURE, ROUTINE-ABSCESS

## 2015-01-11 ENCOUNTER — Inpatient Hospital Stay (HOSPITAL_COMMUNITY): Payer: PRIVATE HEALTH INSURANCE

## 2015-01-11 ENCOUNTER — Inpatient Hospital Stay (HOSPITAL_COMMUNITY)
Admission: AD | Admit: 2015-01-11 | Discharge: 2015-01-11 | Disposition: A | Discharge status: Home / Self Care | Payer: PRIVATE HEALTH INSURANCE | Source: Ambulatory Visit | Attending: Family Medicine | Admitting: Family Medicine

## 2015-01-11 ENCOUNTER — Encounter (HOSPITAL_COMMUNITY): Payer: Self-pay | Admitting: *Deleted

## 2015-01-11 DIAGNOSIS — A5901 Trichomonal vulvovaginitis: Secondary | ICD-10-CM

## 2015-01-11 DIAGNOSIS — D509 Iron deficiency anemia, unspecified: Secondary | ICD-10-CM

## 2015-01-11 DIAGNOSIS — F17210 Nicotine dependence, cigarettes, uncomplicated: Secondary | ICD-10-CM | POA: Insufficient documentation

## 2015-01-11 DIAGNOSIS — R109 Unspecified abdominal pain: Secondary | ICD-10-CM

## 2015-01-11 DIAGNOSIS — D250 Submucous leiomyoma of uterus: Secondary | ICD-10-CM

## 2015-01-11 DIAGNOSIS — N939 Abnormal uterine and vaginal bleeding, unspecified: Secondary | ICD-10-CM

## 2015-01-11 DIAGNOSIS — D649 Anemia, unspecified: Secondary | ICD-10-CM | POA: Insufficient documentation

## 2015-01-11 HISTORY — DX: Unspecified injury of unspecified eye and orbit, initial encounter: S05.90XA

## 2015-01-11 HISTORY — DX: Personal history of other diseases of the female genital tract: Z87.42

## 2015-01-11 HISTORY — DX: Headache, unspecified: R51.9

## 2015-01-11 HISTORY — DX: Headache: R51

## 2015-01-11 HISTORY — DX: Anemia, unspecified: D64.9

## 2015-01-11 LAB — CBC WITH DIFFERENTIAL/PLATELET
Basophils Absolute: 0 10*3/uL (ref 0.0–0.1)
Basophils Relative: 1 % (ref 0–1)
EOS PCT: 2 % (ref 0–5)
Eosinophils Absolute: 0.1 10*3/uL (ref 0.0–0.7)
HEMATOCRIT: 26.8 % — AB (ref 36.0–46.0)
HEMOGLOBIN: 7.7 g/dL — AB (ref 12.0–15.0)
LYMPHS ABS: 1.2 10*3/uL (ref 0.7–4.0)
LYMPHS PCT: 27 % (ref 12–46)
MCH: 17.1 pg — ABNORMAL LOW (ref 26.0–34.0)
MCHC: 28.7 g/dL — ABNORMAL LOW (ref 30.0–36.0)
MCV: 59.7 fL — AB (ref 78.0–100.0)
MONO ABS: 0.5 10*3/uL (ref 0.1–1.0)
MONOS PCT: 11 % (ref 3–12)
NEUTROS ABS: 2.6 10*3/uL (ref 1.7–7.7)
Neutrophils Relative %: 60 % (ref 43–77)
Platelets: 413 10*3/uL — ABNORMAL HIGH (ref 150–400)
RBC: 4.49 MIL/uL (ref 3.87–5.11)
RDW: 22.2 % — AB (ref 11.5–15.5)
WBC: 4.4 10*3/uL (ref 4.0–10.5)

## 2015-01-11 LAB — URINALYSIS, ROUTINE W REFLEX MICROSCOPIC
BILIRUBIN URINE: NEGATIVE
Glucose, UA: NEGATIVE mg/dL
KETONES UR: NEGATIVE mg/dL
Leukocytes, UA: NEGATIVE
NITRITE: NEGATIVE
PH: 6 (ref 5.0–8.0)
PROTEIN: 100 mg/dL — AB
Specific Gravity, Urine: >1.03 — ABNORMAL HIGH (ref 1.005–1.030)
UROBILINOGEN UA: 0.2 mg/dL (ref 0.0–1.0)

## 2015-01-11 LAB — URINE MICROSCOPIC-ADD ON

## 2015-01-11 LAB — WET PREP, GENITAL
CLUE CELLS WET PREP: NONE SEEN
YEAST WET PREP: NONE SEEN

## 2015-01-11 MED ORDER — MEGESTROL ACETATE 40 MG PO TABS: 40.0000 mg | ORAL_TABLET | Freq: Every day | ORAL | Status: DC

## 2015-01-11 MED ORDER — METRONIDAZOLE 500 MG PO TABS
2000.0000 mg | ORAL_TABLET | Freq: Once | ORAL | Status: AC
  Administered 2015-01-11: 2000 mg via ORAL
  Filled 2015-01-11: qty 4

## 2015-01-11 NOTE — MAU Provider Note (Signed)
History     CSN: [id number]  Arrival date and time: 01/11/15 1203   None     Chief Complaint  Patient presents with  . Vaginal Bleeding   HPI [name] is 44 y.o. G0P0000 Unknown weeks presenting with heavy vaginal bleeding.  Reports she has been bleeding for 22 days, soaking through her clothes at work .  Hx of anemia.  She bought multivitamins and FE yesterday.  She has associated sxs of lighteadness, feeling weak, feeling hot all the time.  sexually active with 1 partner, last intercourse 1 week ago.  She would like a pregnancy.    Past Medical History  Diagnosis Date  . H/O menorrhagia   . Ocular injury   . Headache   . Anemia     History reviewed. No pertinent past surgical history.  Family History  Problem Relation Age of Onset  . Diabetes Mother   . Cancer Mother   . Cancer Maternal Aunt   . Diabetes Maternal Grandmother   . Cancer Maternal Grandfather     Social History  Substance Use Topics  . Smoking status: Current Every Day Smoker -- 0.50 packs/day    Types: Cigarettes  . Smokeless tobacco: Never Used  . Alcohol Use: No    Allergies:  Allergies  Allergen Reactions  . Plavix [Clopidogrel Bisulfate] Rash    Prescriptions prior to admission  Medication Sig Dispense Refill Last Dose  . acetaminophen (TYLENOL) 325 MG tablet Take 650 mg by mouth every 6 (six) hours as needed.   01/11/2015 at Unknown time  . aspirin 81 MG tablet Take 81 mg by mouth daily.   01/10/2015 at Unknown time  . IRON PO Take 1 tablet by mouth daily.   01/10/2015 at Unknown time    Review of Systems  Constitutional: Negative for fever and chills.  Respiratory: Negative for shortness of breath.   Cardiovascular: Negative for chest pain.  Gastrointestinal: Positive for nausea, vomiting, abdominal pain and constipation. Negative for diarrhea.  Genitourinary: Positive for frequency. Negative for dysuria, urgency and hematuria.       Neg for vaginal discharge or odor. Lengthy  heavy bleeding with clots.   Neurological: Positive for weakness and headaches.   Physical Exam   Blood pressure 143/80, pulse 116, temperature 98.1 F (36.7 C), temperature source Oral, resp. rate 18.  Physical Exam  Constitutional: She is oriented to person, place, and time. She appears well-developed and well-nourished.  HENT:  Head: Normocephalic.  Neck: Normal range of motion. Neck supple.  Cardiovascular: Normal rate, regular rhythm and normal heart sounds.   Respiratory: Effort normal and breath sounds normal. No respiratory distress.  GI: Soft. There is tenderness (right sided moderate tenderness).  Genitourinary: There is no rash, tenderness or lesion on the right labia. There is no rash, tenderness or lesion on the left labia. Uterus is enlarged and tender. Cervix exhibits no motion tenderness, no discharge and no friability. Right adnexum displays tenderness. Right adnexum displays no mass and no fullness. Left adnexum displays no mass, no tenderness and no fullness. There is bleeding (scant this blood, neg for clot or active bleeding) in the vagina. No vaginal discharge found.  Musculoskeletal: Normal range of motion. She exhibits no edema.  Neurological: She is alert and oriented to person, place, and time.  Skin: Skin is warm and dry.  Psychiatric: She has a normal mood and affect.    Results for orders placed or performed during the hospital encounter of 01/11/15 (from the  past 24 hour(s))  Urinalysis, Routine w reflex microscopic (not at [facility])     Status: Abnormal   Collection Time: 01/11/15 12:20 PM  Result Value Ref Range   Color, Urine YELLOW YELLOW   APPearance CLEAR CLEAR   Specific Gravity, Urine >1.030 (H) 1.005 - 1.030   pH 6.0 5.0 - 8.0   Glucose, UA NEGATIVE NEGATIVE mg/dL   Hgb urine dipstick TRACE (A) NEGATIVE   Bilirubin Urine NEGATIVE NEGATIVE   Ketones, ur NEGATIVE NEGATIVE mg/dL   Protein, ur 100 (A) NEGATIVE mg/dL   Urobilinogen, UA 0.2 0.0 - 1.0  mg/dL   Nitrite NEGATIVE NEGATIVE   Leukocytes, UA NEGATIVE NEGATIVE  Urine microscopic-add on     Status: Abnormal   Collection Time: 01/11/15 12:20 PM  Result Value Ref Range   Squamous Epithelial / LPF RARE RARE   WBC, UA 0-2 <3 WBC/hpf   RBC / HPF 0-2 <3 RBC/hpf   Bacteria, UA MANY (A) RARE   Urine-Other MUCOUS PRESENT   CBC with Differential/Platelet     Status: Abnormal   Collection Time: 01/11/15  1:38 PM  Result Value Ref Range   WBC 4.4 4.0 - 10.5 K/uL   RBC 4.49 3.87 - 5.11 MIL/uL   Hemoglobin 7.7 (L) 12.0 - 15.0 g/dL   HCT 26.8 (L) 36.0 - 46.0 %   MCV 59.7 (L) 78.0 - 100.0 fL   MCH 17.1 (L) 26.0 - 34.0 pg   MCHC 28.7 (L) 30.0 - 36.0 g/dL   RDW 22.2 (H) 11.5 - 15.5 %   Platelets 413 (H) 150 - 400 K/uL   Neutrophils Relative % 60 43 - 77 %   Neutro Abs 2.6 1.7 - 7.7 K/uL   Lymphocytes Relative 27 12 - 46 %   Lymphs Abs 1.2 0.7 - 4.0 K/uL   Monocytes Relative 11 3 - 12 %   Monocytes Absolute 0.5 0.1 - 1.0 K/uL   Eosinophils Relative 2 0 - 5 %   Eosinophils Absolute 0.1 0.0 - 0.7 K/uL   Basophils Relative 1 0 - 1 %   Basophils Absolute 0.0 0.0 - 0.1 K/uL  Wet prep, genital     Status: Abnormal   Collection Time: 01/11/15  1:50 PM  Result Value Ref Range   Yeast Wet Prep HPF POC NONE SEEN NONE SEEN   Trich, Wet Prep FEW (A) NONE SEEN   Clue Cells Wet Prep HPF POC NONE SEEN NONE SEEN   WBC, Wet Prep HPF POC FEW (A) NONE SEEN    MAU Course  Procedures   GC/CHL culture Results for orders placed or performed during the hospital encounter of 01/11/15 (from the past 24 hour(s))  Urinalysis, Routine w reflex microscopic (not at [facility])     Status: Abnormal   Collection Time: 01/11/15 12:20 PM  Result Value Ref Range   Color, Urine YELLOW YELLOW   APPearance CLEAR CLEAR   Specific Gravity, Urine >1.030 (H) 1.005 - 1.030   pH 6.0 5.0 - 8.0   Glucose, UA NEGATIVE NEGATIVE mg/dL   Hgb urine dipstick TRACE (A) NEGATIVE   Bilirubin Urine NEGATIVE NEGATIVE   Ketones, ur  NEGATIVE NEGATIVE mg/dL   Protein, ur 100 (A) NEGATIVE mg/dL   Urobilinogen, UA 0.2 0.0 - 1.0 mg/dL   Nitrite NEGATIVE NEGATIVE   Leukocytes, UA NEGATIVE NEGATIVE  Urine microscopic-add on     Status: Abnormal   Collection Time: 01/11/15 12:20 PM  Result Value Ref Range   Squamous Epithelial / LPF  RARE RARE   WBC, UA 0-2 <3 WBC/hpf   RBC / HPF 0-2 <3 RBC/hpf   Bacteria, UA MANY (A) RARE   Urine-Other MUCOUS PRESENT   CBC with Differential/Platelet     Status: Abnormal   Collection Time: 01/11/15  1:38 PM  Result Value Ref Range   WBC 4.4 4.0 - 10.5 K/uL   RBC 4.49 3.87 - 5.11 MIL/uL   Hemoglobin 7.7 (L) 12.0 - 15.0 g/dL   HCT 26.8 (L) 36.0 - 46.0 %   MCV 59.7 (L) 78.0 - 100.0 fL   MCH 17.1 (L) 26.0 - 34.0 pg   MCHC 28.7 (L) 30.0 - 36.0 g/dL   RDW 22.2 (H) 11.5 - 15.5 %   Platelets 413 (H) 150 - 400 K/uL   Neutrophils Relative % 60 43 - 77 %   Neutro Abs 2.6 1.7 - 7.7 K/uL   Lymphocytes Relative 27 12 - 46 %   Lymphs Abs 1.2 0.7 - 4.0 K/uL   Monocytes Relative 11 3 - 12 %   Monocytes Absolute 0.5 0.1 - 1.0 K/uL   Eosinophils Relative 2 0 - 5 %   Eosinophils Absolute 0.1 0.0 - 0.7 K/uL   Basophils Relative 1 0 - 1 %   Basophils Absolute 0.0 0.0 - 0.1 K/uL  Wet prep, genital     Status: Abnormal   Collection Time: 01/11/15  1:50 PM  Result Value Ref Range   Yeast Wet Prep HPF POC NONE SEEN NONE SEEN   Trich, Wet Prep FEW (A) NONE SEEN   Clue Cells Wet Prep HPF POC NONE SEEN NONE SEEN   WBC, Wet Prep HPF POC FEW (A) NONE SEEN   MDM MSE Labs Exam U/S US Transvaginal Non-ob  01/11/2015   CLINICAL DATA:  Abnormal uterine bleeding x23 days, anemia, history of submucosal fibroids  EXAM: TRANSABDOMINAL AND TRANSVAGINAL ULTRASOUND OF PELVIS  TECHNIQUE: Both transabdominal and transvaginal ultrasound examinations of the pelvis were performed. Transabdominal technique was performed for global imaging of the pelvis including uterus, ovaries, adnexal regions, and pelvic cul-de-sac.  It was necessary to proceed with endovaginal exam following the transabdominal exam to visualize the uterus and bilateral ovaries.  COMPARISON:  MR pelvis dated 08/28/2010  FINDINGS: Uterus  Measurements: 8.3 x 4.8 x 7.0 cm. Dominant 4.4 x 3.7 x 3.2 cm submucosal fibroid in the posterior uterine body. These are better visualized as two distinct fibroids on prior MRI.  Endometrium  Not discretely visualized/distorted by the dominant fibroid.  Right ovary  Measurements: 4.1 x 2.7 x 3.0 cm. 3.0 x 2.9 x 2.2 cm simple cyst/follicle.  Left ovary  Measurements: 3.6 x 1.9 x 2.4 cm. Normal appearance/no adnexal mass.  Other findings  No free fluid.  IMPRESSION: Dominant 4.4 cm submucosal fibroid in the posterior uterine body.   Electronically Signed   By: Julian Hy M.D.   On: 01/11/2015 17:08   US Pelvis Complete  01/11/2015   CLINICAL DATA:  Abnormal uterine bleeding x23 days, anemia, history of submucosal fibroids  EXAM: TRANSABDOMINAL AND TRANSVAGINAL ULTRASOUND OF PELVIS  TECHNIQUE: Both transabdominal and transvaginal ultrasound examinations of the pelvis were performed. Transabdominal technique was performed for global imaging of the pelvis including uterus, ovaries, adnexal regions, and pelvic cul-de-sac. It was necessary to proceed with endovaginal exam following the transabdominal exam to visualize the uterus and bilateral ovaries.  COMPARISON:  MR pelvis dated 08/28/2010  FINDINGS: Uterus  Measurements: 8.3 x 4.8 x 7.0 cm. Dominant 4.4 x 3.7 x  3.2 cm submucosal fibroid in the posterior uterine body. These are better visualized as two distinct fibroids on prior MRI.  Endometrium  Not discretely visualized/distorted by the dominant fibroid.  Right ovary  Measurements: 4.1 x 2.7 x 3.0 cm. 3.0 x 2.9 x 2.2 cm simple cyst/follicle.  Left ovary  Measurements: 3.6 x 1.9 x 2.4 cm. Normal appearance/no adnexal mass.  Other findings  No free fluid.  IMPRESSION: Dominant 4.4 cm submucosal fibroid in the posterior  uterine body.   Electronically Signed   By: Julian Hy M.D.   On: 01/11/2015 17:08  discussed ultrasound results with pt- pt states she has hx of fibroids and myomectomy had been discussed with pt- for some reason not able to be done Pt has hx of abnormal bleeding and anemia Discussed trichomonas vaginitis- will treat pt in MAU and partner will need to get treated Work note until Monday Pt to take Megace 40mg  BID until clinic appointment  16:00  Care turned over to S. Andres Labrum, NP Assessment and Plan  A:  Abdominal pain      Trichomonal infection  P: aub- megace 40mg  BID; note sent to clinic for appointment-  Trich- treated in MAU Anemia- MVT and iron supplement  West Pugh, NP Lesia Hausen M 01/11/2015, 2:43 PM

## 2015-01-11 NOTE — MAU Note (Addendum)
C/o heavy vaginal bleeding for past 22 days; hx of heavy vaginal bleeding and anemia; never been pregnant; not on any BC; c/o feeling weak,lightheaded, bleeding through her clothes at work and feeling HOT all the time;

## 2015-01-12 LAB — HIV ANTIBODY (ROUTINE TESTING W REFLEX): HIV SCREEN 4TH GENERATION: NONREACTIVE

## 2015-01-14 LAB — GC/CHLAMYDIA PROBE AMP (~~LOC~~) NOT AT ARMC
Chlamydia: NEGATIVE
NEISSERIA GONORRHEA: NEGATIVE

## 2015-01-24 ENCOUNTER — Ambulatory Visit (INDEPENDENT_AMBULATORY_CARE_PROVIDER_SITE_OTHER): Payer: PRIVATE HEALTH INSURANCE | Admitting: Obstetrics & Gynecology

## 2015-01-24 ENCOUNTER — Encounter: Payer: Self-pay | Admitting: Obstetrics & Gynecology

## 2015-01-24 ENCOUNTER — Other Ambulatory Visit (HOSPITAL_COMMUNITY): Payer: Self-pay | Admitting: Obstetrics & Gynecology

## 2015-01-24 VITALS — BP 109/88 | HR 92 | Temp 98.2°F | Ht 64.0 in | Wt 149.7 lb

## 2015-01-24 DIAGNOSIS — D250 Submucous leiomyoma of uterus: Secondary | ICD-10-CM

## 2015-01-24 DIAGNOSIS — D259 Leiomyoma of uterus, unspecified: Secondary | ICD-10-CM

## 2015-01-24 NOTE — Progress Notes (Signed)
   Subjective:    Patient ID: [name], female    DOB: 1971/01/29, 44 y.o.   MRN: [id number]  HPI  44 yo S AA G0 here with anemia, fibroids. She is currently on megace and her bleeding has stopped. She had a stroke in 2010.  Review of Systems     Objective:   Physical Exam  [id number] Conversing, breathing, and ambulating normally Add- benign      Assessment & Plan:  Fibroids, anemia, and h/o stroke- strongly recommend [place] or depo lupron/provera Recheck CBC in 4 weeks

## 2015-01-24 NOTE — Progress Notes (Signed)
[place] scheduled for Wednesday morning

## 2015-01-24 NOTE — Patient Instructions (Addendum)
Radiology Intervention to schedule [place] [phone]  Uterine Artery Embolization for Fibroids Uterine artery embolization is a nonsurgical treatment to shrink fibroids. A thin plastic tube (catheter) is used to inject material that blocks off the blood supply to the fibroid, which causes the fibroid to shrink. LET [facility] CARE PROVIDER KNOW ABOUT:  Any allergies you have.  All medicines you are taking, including vitamins, herbs, eye drops, creams, and over-the-counter medicines.  Previous problems you or members of your family have had with the use of anesthetics.  Any blood disorders you have.  Previous surgeries you have had.  Medical conditions you have. RISKS AND COMPLICATIONS  Injury to the uterus from decreased blood supply  Infection.  Blood infection (septicemia).  Lack of menstrual periods (amenorrhea).  Death of tissue cells (necrosis) around your bladder or vulva.  Development of a hole between organs or from an organ to the surface of your skin (fistula).  Blood clot in the legs (deep vein thrombosis) or lung (pulmonary embolus). BEFORE THE PROCEDURE  Ask your health care provider about changing or stopping your regular medicines.   Do not take aspirin or blood thinners (anticoagulants) for 1 week before the surgery or as directed by your health care provider.  Do not eat or drink anything for 8 hours before the surgery or as directed by your health care provider.   Empty your bladder before the procedure begins. PROCEDURE   An IV tube will be placed into one of your veins. This will be used to give you a sedative and pain medication (conscious sedation).  You will be given a medicine that numbs the area (local anesthetic).  A small cut will be made in your groin. A catheter is then inserted into the main artery of your leg.  The catheter will be guided through the artery to your uterus. A series of images will be taken while dye is injected through  the catheter in your groin. X-rays are taken at the same time. This is done to provide a road map of the blood supply to your uterus and fibroids.  Tiny plastic spheres, about the size of sand grains, will be injected through the catheter. Metal coils may be used to help block the artery. The particles will lodge in tiny branches of the uterine artery that supplies blood to the fibroids.  The procedure is repeated on the artery that supplies the other side of the uterus.  The catheter is then removed and pressure is held to stop any bleeding. No stitches are needed.  A dressing is then placed over the cut (incision). AFTER THE PROCEDURE  You will be taken to a recovery area where your progress will be monitored until you are awake, stable, and taking fluids well. If there are no other problems, you will then be moved to a regular hospital room.  You will be observed overnight in the hospital.  You will have cramping that should be controlled with pain medication. Document Released: 08/03/2005 Document Revised: 03/08/2013 Document Reviewed: 12/01/2012 [facility] Patient Information 2015 [place], [place]. This information is not intended to replace advice given to you by your health care provider. Make sure you discuss any questions you have with your health care provider.

## 2015-02-25 ENCOUNTER — Ambulatory Visit: Payer: PRIVATE HEALTH INSURANCE | Admitting: Obstetrics & Gynecology

## 2015-02-27 ENCOUNTER — Ambulatory Visit
Admission: RE | Admit: 2015-02-27 | Discharge: 2015-02-27 | Disposition: A | Discharge status: Home / Self Care | Payer: PRIVATE HEALTH INSURANCE | Source: Ambulatory Visit | Attending: Obstetrics & Gynecology | Admitting: Obstetrics & Gynecology

## 2015-02-27 ENCOUNTER — Other Ambulatory Visit: Payer: Self-pay | Admitting: General Practice

## 2015-02-27 DIAGNOSIS — D259 Leiomyoma of uterus, unspecified: Secondary | ICD-10-CM

## 2015-02-27 HISTORY — DX: Benign neoplasm of connective and other soft tissue, unspecified: D21.9

## 2015-02-27 NOTE — Consult Note (Signed)
Chief Complaint: Patient was seen in consultation today for  Chief Complaint  Patient presents with  . Follow-up    Consult for [place]     at the request of [place] C  Referring Physician(s): Dove,Myra C  History of Present Illness: [name] is a 44 y.o. female known to our service from a previous consultation 08/27/2010 for symptomatic uterine fibroids. She was interested in maintaining her fertility at that time and deferred invasive treatment. She now presents with progressive menorrhagia and dysmenorrhea. She remains G0 P0, with uncertain plans for future pregnancy. Her menstrual cycle is irregular with heavy flow and large clots fairly constantly requiring changing pads every 30 minutes and significant interperiod bleeding. Recent hemoglobin 7.7. She's taking iron and Megace. She does square describes some bulk symptoms including frequent urination and urinary frequency as well as abdominal pelvic pressure. She's had no previous fibroid surgery or other fibroid therapy. Ultrasound performed8/20/2016 confirmed persistent uterine fibroids.  Past Medical History  Diagnosis Date  . H/O menorrhagia   . Ocular injury   . Headache   . Anemia   . Fibroid     No past surgical history on file.  Allergies: Plavix  Medications: Prior to Admission medications   Medication Sig Start Date End Date Taking? Authorizing Provider  acetaminophen (TYLENOL) 325 MG tablet Take 650 mg by mouth every 6 (six) hours as needed.   Yes Historical Provider, MD  aspirin 81 MG tablet Take 81 mg by mouth daily.   Yes Historical Provider, MD  IRON PO Take 1 tablet by mouth daily.   Yes Historical Provider, MD  megestrol (MEGACE) 40 MG tablet Take 1 tablet (40 mg total) by mouth daily. 01/11/15  Yes West Pugh, NP  Multiple Vitamins-Minerals (WOMENS MULTIVITAMIN PLUS PO) Take 1 tablet by mouth daily.   Yes Historical Provider, MD     Family History  Problem Relation Age of Onset  .  Diabetes Mother   . Cancer Mother   . Cancer Maternal Aunt   . Diabetes Maternal Grandmother   . Cancer Maternal Grandfather     Social History   Social History  . Marital Status: Single    Spouse Name: N/A  . Number of Children: N/A  . Years of Education: N/A   Social History Main Topics  . Smoking status: Current Every Day Smoker -- 0.50 packs/day    Types: Cigarettes    Start date: 02/27/1995  . Smokeless tobacco: Never Used  . Alcohol Use: 0.0 oz/week    0 Standard drinks or equivalent per week     Comment: rarely on social occasions   . Drug Use: 2.00 per week     Comment: marijuana (last used 02/26/2015)    . Sexual Activity: Not on file   Other Topics Concern  . Not on file   Social History Narrative    ECOG Status: 2 - Symptomatic, <50% confined to bed  Review of Systems: A 12 point ROS discussed and pertinent positives are indicated in the HPI above.  All other systems are negative.  Review of Systems  Vital Signs: BP 144/80 mmHg  Pulse 72  Temp(Src) 98.4 F (36.9 C) (Oral)  Resp 14  Ht 5' 0.75" (1.543 m)  Wt 150 lb (68.04 kg)  BMI 28.58 kg/m2  SpO2 100%  LMP 01/10/2015 (Approximate)  Physical Exam  M[place]ati Score:     Imaging: No results found.  Labs:  CBC:  Recent Labs  01/11/15 1338  WBC 4.4  HGB 7.7*  HCT 26.8*  PLT 413*    COAGS: No results for input(s): INR, APTT in the last 8760 hours.  BMP: No results for input(s): NA, K, CL, CO2, GLUCOSE, BUN, CALCIUM, CREATININE, GFRNONAA, GFRAA in the last 8760 hours.  Invalid input(s): CMP  LIVER FUNCTION TESTS: No results for input(s): BILITOT, AST, ALT, ALKPHOS, PROT, ALBUMIN in the last 8760 hours.  TUMOR MARKERS: No results for input(s): AFPTM, CEA, CA199, CHROMGRNA in the last 8760 hours.  Assessment and Plan:  My impression is that this patient remains an appropriate candidate for consideration of fibroid treatment due to her significant abnormal uterine bleeding. We  discussed the pathophysiology of uterine fibroids. We discussed treatment options including conservative watchful waiting, hysterectomy, myomectomy, and fibroid embolization. We discussed that fibroid embolization does not represent fertility treatment. We discussed the technique of UFE, anticipated benefits, time course of symptom resolution, possible risks and complications including possible urgent hysterectomy. We discussed likelihood of clinical relief of symptoms 90%. She seemed to understand and did ask appropriate questions.  Since it has been 4 years since her initial presentation, I think it would be prudent to repeat the pelvic MR with contrast, ensure that she's developed no pedunculated subserosal or submucosal fibroids on a narrow stalk, as well as to exclude any interval pelvic pathology. We can set this up as an outpatient at her convenience. Assuming this looks okay, we can set up fibroid embolization at her convenience. She is motivated to proceed.  Thank you for this interesting consult.  I greatly enjoyed meeting NARIYAH OSIAS and look forward to participating in their care.  A copy of this report was sent to the requesting provider on this date.  Signed: HASSELL III, DAYNE DANIEL 02/27/2015, 3:41 PM   I spent a total of    25 Minutes in face to face in clinical consultation, greater than 50% of which was counseling/coordinating care for symptomatic uterine fibroids.

## 2015-03-08 ENCOUNTER — Ambulatory Visit: Payer: PRIVATE HEALTH INSURANCE | Admitting: Medical

## 2015-03-14 ENCOUNTER — Ambulatory Visit
Admission: RE | Admit: 2015-03-14 | Discharge: 2015-03-14 | Disposition: A | Discharge status: Home / Self Care | Payer: PRIVATE HEALTH INSURANCE | Source: Ambulatory Visit | Attending: Obstetrics & Gynecology | Admitting: Obstetrics & Gynecology

## 2015-03-14 DIAGNOSIS — D259 Leiomyoma of uterus, unspecified: Secondary | ICD-10-CM

## 2015-03-14 MED ORDER — GADOBENATE DIMEGLUMINE 529 MG/ML IV SOLN
14.0000 mL | Freq: Once | INTRAVENOUS | Status: DC | PRN
Start: 2015-03-14 — End: 2015-03-15

## 2015-03-25 ENCOUNTER — Telehealth: Payer: Self-pay | Admitting: *Deleted

## 2015-03-25 NOTE — Telephone Encounter (Signed)
I called Dr Alease Medina office and let them know Dr Vernard Gambles reviewed [name] report and she is not a candidate for UFE procedure. A solitary fibroid is intracavitary,she needs to see gynecologist about hysteroscopic resection of this.  I also called the patient and lmom that she is not a candidate for UFE and that someone from Dr Hulan Fray office would be contacting her.

## 2015-03-25 NOTE — Telephone Encounter (Signed)
Received a call from [facility] stating [name] was referred to them by [place] for a uterine fibroid embolization. Per Dr. Vernard Gambles who reviewed her MRI- he states she is not a candidate for uterine artery embolization. States she has a solitary fibroid and it is intracavitary . States needs to see her gynecologist for a hysteroscopic resection.  They will call patient and tell her she is not a candidate. Informed them I would forward message to Dr. Hulan Fray.

## 2015-04-23 ENCOUNTER — Telehealth: Payer: Self-pay | Admitting: *Deleted

## 2015-04-23 NOTE — Telephone Encounter (Signed)
Received a message from Dr. Hulan Fray asking [place] to forward to Dr. Roselie Awkward as he does hysteroscopic resection better.  Will forward to Dr Roselie Awkward.

## 2015-04-23 NOTE — Telephone Encounter (Signed)
Opened in error

## 2015-04-25 NOTE — Telephone Encounter (Signed)
I can see this patient to consider surgery

## 2015-04-29 NOTE — Telephone Encounter (Signed)
Called patient and informed her that it was recommended she follow up with one of our doctors for possible surgery since GI could not see her for surgery. Patient verbalized understanding and will await a call for appt. Patient had no questions

## 2015-05-16 ENCOUNTER — Ambulatory Visit: Payer: PRIVATE HEALTH INSURANCE | Admitting: Obstetrics & Gynecology

## 2017-03-09 ENCOUNTER — Inpatient Hospital Stay (HOSPITAL_COMMUNITY)
Admission: AD | Admit: 2017-03-09 | Discharge: 2017-03-09 | Disposition: A | Discharge status: Home / Self Care | Payer: Self-pay | Source: Ambulatory Visit | Attending: Obstetrics and Gynecology | Admitting: Obstetrics and Gynecology

## 2017-03-09 ENCOUNTER — Encounter (HOSPITAL_COMMUNITY): Payer: Self-pay | Admitting: *Deleted

## 2017-03-09 ENCOUNTER — Inpatient Hospital Stay (HOSPITAL_COMMUNITY): Payer: Self-pay

## 2017-03-09 DIAGNOSIS — D509 Iron deficiency anemia, unspecified: Secondary | ICD-10-CM | POA: Insufficient documentation

## 2017-03-09 DIAGNOSIS — R000 Tachycardia, unspecified: Secondary | ICD-10-CM | POA: Insufficient documentation

## 2017-03-09 DIAGNOSIS — Z7982 Long term (current) use of aspirin: Secondary | ICD-10-CM | POA: Insufficient documentation

## 2017-03-09 DIAGNOSIS — R1032 Left lower quadrant pain: Secondary | ICD-10-CM | POA: Insufficient documentation

## 2017-03-09 DIAGNOSIS — N200 Calculus of kidney: Secondary | ICD-10-CM | POA: Insufficient documentation

## 2017-03-09 DIAGNOSIS — F17210 Nicotine dependence, cigarettes, uncomplicated: Secondary | ICD-10-CM | POA: Insufficient documentation

## 2017-03-09 DIAGNOSIS — E0590 Thyrotoxicosis, unspecified without thyrotoxic crisis or storm: Secondary | ICD-10-CM | POA: Insufficient documentation

## 2017-03-09 DIAGNOSIS — R109 Unspecified abdominal pain: Secondary | ICD-10-CM

## 2017-03-09 DIAGNOSIS — Z3202 Encounter for pregnancy test, result negative: Secondary | ICD-10-CM | POA: Insufficient documentation

## 2017-03-09 LAB — URINALYSIS, ROUTINE W REFLEX MICROSCOPIC
Glucose, UA: 50 mg/dL — AB
Hgb urine dipstick: NEGATIVE
KETONES UR: NEGATIVE mg/dL
Nitrite: NEGATIVE
Protein, ur: 100 mg/dL — AB
SPECIFIC GRAVITY, URINE: 1.031 — AB (ref 1.005–1.030)
pH: 5 (ref 5.0–8.0)

## 2017-03-09 LAB — COMPREHENSIVE METABOLIC PANEL
ALBUMIN: 3.7 g/dL (ref 3.5–5.0)
ALK PHOS: 105 U/L (ref 38–126)
ALT: 22 U/L (ref 14–54)
AST: 27 U/L (ref 15–41)
Anion gap: 9 (ref 5–15)
BILIRUBIN TOTAL: 0.2 mg/dL — AB (ref 0.3–1.2)
BUN: 18 mg/dL (ref 6–20)
CALCIUM: 10.6 mg/dL — AB (ref 8.9–10.3)
CO2: 26 mmol/L (ref 22–32)
CREATININE: 0.52 mg/dL (ref 0.44–1.00)
Chloride: 99 mmol/L — ABNORMAL LOW (ref 101–111)
GFR calc Af Amer: >60 mL/min (ref >60)
GLUCOSE: 156 mg/dL — AB (ref 65–99)
POTASSIUM: 4.4 mmol/L (ref 3.5–5.1)
Sodium: 134 mmol/L — ABNORMAL LOW (ref 135–145)
TOTAL PROTEIN: 7.7 g/dL (ref 6.5–8.1)

## 2017-03-09 LAB — CBC WITH DIFFERENTIAL/PLATELET
BASOS ABS: 0 10*3/uL (ref 0.0–0.1)
Basophils Relative: 0 %
EOS ABS: 0.2 10*3/uL (ref 0.0–0.7)
Eosinophils Relative: 3 %
HEMATOCRIT: 43.5 % (ref 36.0–46.0)
HEMOGLOBIN: 13.8 g/dL (ref 12.0–15.0)
LYMPHS PCT: 47 %
Lymphs Abs: 2.7 10*3/uL (ref 0.7–4.0)
MCH: 21.4 pg — ABNORMAL LOW (ref 26.0–34.0)
MCHC: 31.7 g/dL (ref 30.0–36.0)
MCV: 67.4 fL — AB (ref 78.0–100.0)
MONOS PCT: 6 %
Monocytes Absolute: 0.4 10*3/uL (ref 0.1–1.0)
NEUTROS ABS: 2.6 10*3/uL (ref 1.7–7.7)
NEUTROS PCT: 44 %
Platelets: 316 10*3/uL (ref 150–400)
RBC: 6.45 MIL/uL — ABNORMAL HIGH (ref 3.87–5.11)
RDW: 18.1 % — AB (ref 11.5–15.5)
WBC: 5.9 10*3/uL (ref 4.0–10.5)

## 2017-03-09 LAB — T4, FREE: Free T4: >5.5 ng/dL — ABNORMAL HIGH (ref 0.61–1.12)

## 2017-03-09 LAB — TSH

## 2017-03-09 LAB — POCT PREGNANCY, URINE: PREG TEST UR: NEGATIVE

## 2017-03-09 MED ORDER — LACTATED RINGERS IV BOLUS (SEPSIS): 1000.0000 mL | Freq: Once | INTRAVENOUS | Status: DC

## 2017-03-09 MED ORDER — TAMSULOSIN HCL 0.4 MG PO CAPS: 0.4000 mg | ORAL_CAPSULE | Freq: Every day | ORAL | 0 refills | Status: DC

## 2017-03-09 MED ORDER — MORPHINE SULFATE (PF) 4 MG/ML IV SOLN
1.0000 mg | Freq: Once | INTRAVENOUS | Status: AC
  Administered 2017-03-09: 1 mg via INTRAVENOUS
  Filled 2017-03-09: qty 1

## 2017-03-09 MED ORDER — OXYCODONE-ACETAMINOPHEN 5-325 MG PO TABS: 1.0000 | ORAL_TABLET | Freq: Four times a day (QID) | ORAL | 0 refills | Status: DC | PRN

## 2017-03-09 MED ORDER — LACTATED RINGERS IV BOLUS (SEPSIS)
1000.0000 mL | Freq: Once | INTRAVENOUS | Status: AC
  Administered 2017-03-09: 1000 mL via INTRAVENOUS

## 2017-03-09 MED ORDER — METOPROLOL TARTRATE 5 MG/5ML IV SOLN
5.0000 mg | Freq: Once | INTRAVENOUS | Status: AC
  Administered 2017-03-09: 5 mg via INTRAVENOUS
  Filled 2017-03-09: qty 5

## 2017-03-09 MED ORDER — DOCUSATE SODIUM 50 MG PO CAPS: 50.0000 mg | ORAL_CAPSULE | Freq: Two times a day (BID) | ORAL | 0 refills | Status: DC

## 2017-03-09 MED ORDER — METOPROLOL TARTRATE 25 MG PO TABS: 25.0000 mg | ORAL_TABLET | Freq: Two times a day (BID) | ORAL | 1 refills | Status: DC

## 2017-03-09 NOTE — MAU Provider Note (Signed)
Chief Complaint: Emesis and Abdominal Pain   SUBJECTIVE HPI: [name] is a 46 y.o. [id number] who presents to MAU due to nausea vomiting and severe abdominal pain. Patient reports that nausea and  vomiting began more than a week ago. She also endorses some persistent constipation that she tried to self treat with Dulcolax; not much of a bowel movement afterwards. She has a history of constipation. Patient states she has been taking an over-the-counter vitamin with iron due to her history of iron deficiency anemia. Patient states that she was trying to treat her anemia. Patient endorses weight loss of 50 pounds in the last 4 months. She states that originally it was intentional but now is not. She wants to eat but is afraid to eat due to the vomiting. She is able to keep small portions down. She localizes abdominal pain to the left lower quadrant and rates it a 6-7. She endorses tremors and palpitations. Has some dysuria. Denies vaginal bleeding, hematuria, fevers, chills.  Patient is a daily tobacco user with occasional marijuana Denies any family history of colon cancer History of fibroids for which she is no longer being treated. Last menstrual period was in July. Currently taking any medications reports hot flashes, vaginal dryness, dyspareunia the last year. Patient does not have a primary care provider for which she sees.  Past Medical History:  Diagnosis Date  . Anemia   . Fibroid   . H/O menorrhagia   . Headache   . Ocular injury    OB History  Gravida Para Term Preterm AB Living  0 0 0 0 0 0  SAB TAB Ectopic Multiple Live Births  0 0 0 0         Past Surgical History:  Procedure Laterality Date  . WISDOM TOOTH EXTRACTION     Social History   Social History  . Marital status: Single    Spouse name: N/A  . Number of children: N/A  . Years of education: N/A   Occupational History  . Not on file.   Social History Main Topics  . Smoking status: Current Every Day  Smoker    Packs/day: 0.25    Types: Cigarettes    Start date: 02/27/1995  . Smokeless tobacco: Never Used  . Alcohol use No     Comment: rarely on social occasions   . Drug use: Yes    Types: Marijuana     Comment: Last marijuana 03/07/17  . Sexual activity: Yes   Other Topics Concern  . Not on file   Social History Narrative  . No narrative on file   No current facility-administered medications on file prior to encounter.    Current Outpatient Prescriptions on File Prior to Encounter  Medication Sig Dispense Refill  . acetaminophen (TYLENOL) 325 MG tablet Take 650 mg by mouth every 6 (six) hours as needed.    Marland Kitchen aspirin 81 MG tablet Take 81 mg by mouth daily.    . IRON PO Take 1 tablet by mouth daily.    . megestrol (MEGACE) 40 MG tablet Take 1 tablet (40 mg total) by mouth daily. 60 tablet 1  . Multiple Vitamins-Minerals (WOMENS MULTIVITAMIN PLUS PO) Take 1 tablet by mouth daily.     Allergies  Allergen Reactions  . Plavix [Clopidogrel Bisulfate] Rash    I have reviewed the past Medical Hx, Surgical Hx, Social Hx, Allergies and Medications.   REVIEW OF SYSTEMS All systems reviewed and are negative for acute change except as  noted in the HPI.   OBJECTIVE BP (!) 147/90   Pulse (!) 140   Temp 98.3 F (36.8 C) (Oral)   Resp 18   Ht 5\' 3"  (1.6 m)   Wt 54.9 kg (121 lb)   LMP 11/07/2016   SpO2 100%   BMI 21.43 kg/m    PHYSICAL EXAM Constitutional: Well-developed, cachectic appeaing female in no acute distress. HEENT: optholamos L>R, goiter without nodules, no lymphadenopathy, NCAT , scelera not injected or icteric Cardiovascular: normal rate and rhythm, pulses intact Respiratory: normal rate and effort.  GI: Abd soft, tender to palpation in left quadrant, non-distended. Voluntary guarding in LLQ, no rebound tenderness MS: Extremities nontender, no edema, normal ROM Neurologic: Alert and oriented x 4. No focal deficits GU: Neg CVAT. Psych: normal mood and  affect  LAB RESULTS Results for orders placed or performed during the hospital encounter of 03/09/17  Urinalysis, Routine w reflex microscopic  Result Value Ref Range   Color, Urine AMBER (A) YELLOW   APPearance CLOUDY (A) CLEAR   Specific Gravity, Urine 1.031 (H) 1.005 - 1.030   pH 5.0 5.0 - 8.0   Glucose, UA 50 (A) NEGATIVE mg/dL   Hgb urine dipstick NEGATIVE NEGATIVE   Bilirubin Urine SMALL (A) NEGATIVE   Ketones, ur NEGATIVE NEGATIVE mg/dL   Protein, ur 100 (A) NEGATIVE mg/dL   Nitrite NEGATIVE NEGATIVE   Leukocytes, UA TRACE (A) NEGATIVE   RBC / HPF 6-30 0 - 5 RBC/hpf   WBC, UA 6-30 0 - 5 WBC/hpf   Bacteria, UA RARE (A) NONE SEEN   Squamous Epithelial / LPF 0-5 (A) NONE SEEN   Mucus PRESENT    Granular Casts, UA PRESENT   CBC with Differential/Platelet  Result Value Ref Range   WBC 5.9 4.0 - 10.5 K/uL   RBC 6.45 (H) 3.87 - 5.11 MIL/uL   Hemoglobin 13.8 12.0 - 15.0 g/dL   HCT 43.5 36.0 - 46.0 %   MCV 67.4 (L) 78.0 - 100.0 fL   MCH 21.4 (L) 26.0 - 34.0 pg   MCHC 31.7 30.0 - 36.0 g/dL   RDW 18.1 (H) 11.5 - 15.5 %   Platelets 316 150 - 400 K/uL   Neutrophils Relative % 44 %   Lymphocytes Relative 47 %   Monocytes Relative 6 %   Eosinophils Relative 3 %   Basophils Relative 0 %   Neutro Abs 2.6 1.7 - 7.7 K/uL   Lymphs Abs 2.7 0.7 - 4.0 K/uL   Monocytes Absolute 0.4 0.1 - 1.0 K/uL   Eosinophils Absolute 0.2 0.0 - 0.7 K/uL   Basophils Absolute 0.0 0.0 - 0.1 K/uL  Comprehensive metabolic panel  Result Value Ref Range   Sodium 134 (L) 135 - 145 mmol/L   Potassium 4.4 3.5 - 5.1 mmol/L   Chloride 99 (L) 101 - 111 mmol/L   CO2 26 22 - 32 mmol/L   Glucose, Bld 156 (H) 65 - 99 mg/dL   BUN 18 6 - 20 mg/dL   Creatinine, Ser 0.52 0.44 - 1.00 mg/dL   Calcium 10.6 (H) 8.9 - 10.3 mg/dL   Total Protein 7.7 6.5 - 8.1 g/dL   Albumin 3.7 3.5 - 5.0 g/dL   AST 27 15 - 41 U/L   ALT 22 14 - 54 U/L   Alkaline Phosphatase 105 38 - 126 U/L   Total Bilirubin 0.2 (L) 0.3 - 1.2 mg/dL    GFR calc non Af Amer >60 >60 mL/min   GFR calc Af  Amer >60 >60 mL/min   Anion gap 9 5 - 15  TSH  Result Value Ref Range   TSH <0.010 (L) 0.350 - 4.500 uIU/mL  T4, free  Result Value Ref Range   Free T4 >5.50 (H) 0.61 - 1.12 ng/dL  Pregnancy, urine POC  Result Value Ref Range   Preg Test, Ur NEGATIVE NEGATIVE     IMAGING Dg Abd 1 View  Result Date: 03/09/2017 CLINICAL DATA:  Abdominal pain.  Nausea, vomiting and constipation. EXAM: ABDOMEN - 1 VIEW COMPARISON:  None. FINDINGS: The bowel gas pattern is normal. Stone within the left kidney measures 4 mm. IMPRESSION: Left renal calculus. Normal bowel gas pattern. Electronically Signed   By: Kerby Moors M.D.   On: 03/09/2017 17:06    MAU COURSE Vitals and nursing notes reviewed - Tachycardia on admission to 160 with elevated BP I have ordered labs/imaging and reviewed them EKG showing normal sinus tachycardia UA dirty but without concern for UTI Upreg - NEG No leukocytosis and hemoglobin is normal (no anemia appreciated) Elevated glucose; electrolytes otherwise stable TSH low, T4 pending Treatments given in MAU:  1640 - metoprolol IV 5mg  and LR bolus for tachycardia 1700 - minimal improvement in tachycardia Imaging came back with renal calculi 1730 - morhphine IV for pain; if control pain will hopefully help with tachycardia Tachycardia improved to 120s  Case discussed with Dr. Nehemiah Settle  MDM Plan of care reviewed with patient, including labs and tests ordered and medical treatment.   ASSESSMENT 1. Hyperthyroidism   2. Abdominal pain   3. Renal calculi   4. Sinus tachycardia     PLAN Discharge home in stable condition Rx for metoprolol to help with tachycardia and symptoms of hyperthyroidism Providers given for PCP and endocrinology follow-up Referral placed for endocrinology urgently Rx for bowel regimen given with instructions For kidney stone pain medication and Flomax prescribed Counseled on return  precautions Handout given   Luiz Blare, DO [place], [place] 03/09/2017, 4:35 PM

## 2017-03-09 NOTE — MAU Note (Signed)
Pt last ate a full meal last Monday, started vomiting on Tuesday, also constipated, took dulcolax.  Had one large BM, then BM on Sunday, none since.Has hx of fibroids, also anemia.  Has been taking vitamins to help with the anemia.  Has had temp around 99 since Sunday.  Has been able to hold down liquids.  LLQ pain since last Monday night.  Denies bleeding, hasn't had a period in 3 months.

## 2017-03-09 NOTE — Discharge Instructions (Signed)
Hyperthyroidism Hyperthyroidism is when the thyroid is too active (overactive). Your thyroid is a large gland that is located in your neck. The thyroid helps to control how your body uses food (metabolism). When your thyroid is overactive, it produces too much of a hormone called thyroxine. What are the causes? Causes of hyperthyroidism may include:  Graves disease. This is when your immune system attacks the thyroid gland. This is the most common cause.  Inflammation of the thyroid gland.  Tumor in the thyroid gland or somewhere else.  Excessive use of thyroid medicines, including: ? Prescription thyroid supplement. ? Herbal supplements that mimic thyroid hormones.  Solid or fluid-filled lumps within your thyroid gland (thyroid nodules).  Excessive ingestion of iodine.  What increases the risk?  Being female.  Having a family history of thyroid conditions. What are the signs or symptoms? Signs and symptoms of hyperthyroidism may include:  Nervousness.  Inability to tolerate heat.  Unexplained weight loss.  Diarrhea.  Change in the texture of hair or skin.  Heart skipping beats or making extra beats.  Rapid heart rate.  Loss of menstruation.  Shaky hands.  Fatigue.  Restlessness.  Increased appetite.  Sleep problems.  Enlarged thyroid gland or nodules.  How is this diagnosed? Diagnosis of hyperthyroidism may include:  Medical history and physical exam.  Blood tests.  Ultrasound tests.  How is this treated? Treatment may include:  Medicines to control your thyroid.  Surgery to remove your thyroid.  Radiation therapy.  Follow these instructions at home:  Take medicines only as directed by your health care provider.  Do not use any tobacco products, including cigarettes, chewing tobacco, or electronic cigarettes. If you need help quitting, ask your health care provider.  Do not exercise or do physical activity until your health care provider  approves.  Keep all follow-up appointments as directed by your health care provider. This is important. Contact a health care provider if:  Your symptoms do not get better with treatment.  You have fever.  You are taking thyroid replacement medicine and you: ? Have depression. ? Feel mentally and physically slow. ? Have weight gain. Get help right away if:  You have decreased alertness or a change in your awareness.  You have abdominal pain.  You feel dizzy.  You have a rapid heartbeat.  You have an irregular heartbeat. This information is not intended to replace advice given to you by your health care provider. Make sure you discuss any questions you have with your health care provider. Document Released: 05/18/2005 Document Revised: 10/17/2015 Document Reviewed: 10/03/2013 Elsevier Interactive Patient Education  2017 [employer]  

## 2017-03-19 ENCOUNTER — Ambulatory Visit (INDEPENDENT_AMBULATORY_CARE_PROVIDER_SITE_OTHER): Payer: Self-pay | Admitting: Endocrinology

## 2017-03-19 ENCOUNTER — Encounter: Payer: Self-pay | Admitting: Endocrinology

## 2017-03-19 DIAGNOSIS — E0590 Thyrotoxicosis, unspecified without thyrotoxic crisis or storm: Secondary | ICD-10-CM

## 2017-03-19 MED ORDER — METHIMAZOLE 10 MG PO TABS: 40.0000 mg | ORAL_TABLET | Freq: Two times a day (BID) | ORAL | 3 refills | Status: DC

## 2017-03-19 MED ORDER — METOPROLOL TARTRATE 25 MG PO TABS: 25.0000 mg | ORAL_TABLET | Freq: Two times a day (BID) | ORAL | 1 refills | Status: DC

## 2017-03-19 NOTE — Progress Notes (Signed)
Subjective:    Patient ID: [name], female    DOB: Jan 20, 1971, 46 y.o.   MRN: [id number]  HPI Pt is referred by Dr Gerarda Fraction, for hyperthyroidism.  Pt was dx'ed with mild hyperthyroidism in 2012.  she has never been on therapy for this.  she has never had XRT to the anterior neck, or thyroid surgery.  she has never had dediated thyroid imaging.  she does not consume kelp or any other non-prescribed thyroid medication.  she has never been on amiodarone.  She was seen last [facility] at [facility] with moderate pain throughout the abdomen, and assoc n/v.    Past Medical History:  Diagnosis Date  . Anemia   . Fibroid   . H/O menorrhagia   . Headache   . Ocular injury     Past Surgical History:  Procedure Laterality Date  . WISDOM TOOTH EXTRACTION      Social History   Social History  . Marital status: Single    Spouse name: N/A  . Number of children: N/A  . Years of education: N/A   Occupational History  . Not on file.   Social History Main Topics  . Smoking status: Current Every Day Smoker    Packs/day: 0.25    Types: Cigarettes    Start date: 02/27/1995  . Smokeless tobacco: Never Used  . Alcohol use No     Comment: rarely on social occasions   . Drug use: Yes    Types: Marijuana     Comment: Last marijuana 03/07/17  . Sexual activity: Yes   Other Topics Concern  . Not on file   Social History Narrative  . No narrative on file    Current Outpatient Prescriptions on File Prior to Visit  Medication Sig Dispense Refill  . aspirin 81 MG tablet Take 81 mg by mouth daily.    Marland Kitchen docusate sodium (COLACE) 50 MG capsule Take 1 capsule (50 mg total) by mouth 2 (two) times daily. (Patient not taking: Reported on 03/19/2017) 10 capsule 0  . IRON PO Take 1 tablet by mouth daily.    . Multiple Vitamins-Minerals (WOMENS MULTIVITAMIN PLUS PO) Take 1 tablet by mouth daily.     No current facility-administered medications on file prior to visit.     Allergies  Allergen  Reactions  . Plavix [Clopidogrel Bisulfate] Rash    Family History  Problem Relation Age of Onset  . Diabetes Mother   . Cancer Mother   . Cancer Maternal Aunt   . Diabetes Maternal Grandmother   . Cancer Maternal Grandfather     BP 128/84   Pulse (!) 124   Wt 125 lb 9.6 oz (57 kg)   LMP 09/17/2016 (Approximate)   SpO2 98%   BMI 22.25 kg/m    Review of Systems denies hoarseness, sob, diarrhea, polyuria, muscle weakness, edema, anxiety, easy bruising, and rhinorrhea. She has tremor, weight loss (50 lbs x 10 months), decreased menstruation, headache, heat intolerance, excessive diaphoresis, and palpitations. She sees poorly from the left eye.     Objective:   Physical Exam VITAL SIGNS:  See vs page.  GENERAL: no distress.  VS: see vs page GEN: no distress HEAD: head: no deformity eyes: no periorbital swelling; there is bilat proptosis and dysconjugate gaze (pt says due to cva).  external nose and ears are normal mouth: no lesion seen NECK: thyroid is approx 5 times normal size.   CHEST WALL: no deformity LUNGS: clear to auscultation CV: reg  rate and rhythm, no murmur ABD: abdomen is soft, nontender.  no hepatosplenomegaly.  not distended.  no hernia MUSCULOSKELETAL: muscle bulk and strength are grossly normal.  no obvious joint swelling.  gait is normal and steady EXTEMITIES: no deformity.  no edema PULSES: no carotid bruit NEURO:  cn 2-12 grossly intact.   readily moves all 4's.  sensation is intact to touch on all 4's.  Moderate tremor of the hands.  SKIN:  Normal texture and temperature.  No rash or suspicious lesion is visible.  Not diaphoretic.  NODES:  None palpable at the neck PSYCH: alert, well-oriented.  Does not appear anxious nor depressed.   CT: There are several low density lesions in the right thyroid measuring there is a 1 cm.  There are small low density lesions in the left thyroid as well.  These findings are most likely due to multinodular  goiter.  I have reviewed outside records, and summarized: Pt was noted to have hyperthyroidism, and referred here.  She was rx'ed metoprolol, but she did not take.    Lab Results  Component Value Date   TSH <0.010 (L) 03/09/2017      Assessment & Plan:  Multinodular goiter, which is usually hereditary.  Hyperthyroidism, new to me.  prob due to the goiter.  GI sxs, caused or contributed to by hyperthyroidism.   Patient Instructions  I have sent a prescription to your pharmacy, to refill the metoprolol, and: Here is a prescription for a pill to slow the thyroid: called "methimazole." If ever you have fever while taking methimazole, stop it and call us, even if the reason is obvious, because of the risk of a rare side-effect. In view of your medical condition, you should avoid pregnancy until we have decided it is safe Please come back for a follow-up appointment in 3 weeks.

## 2017-03-19 NOTE — Patient Instructions (Signed)
I have sent a prescription to your pharmacy, to refill the metoprolol, and: Here is a prescription for a pill to slow the thyroid: called "methimazole." If ever you have fever while taking methimazole, stop it and call us, even if the reason is obvious, because of the risk of a rare side-effect. In view of your medical condition, you should avoid pregnancy until we have decided it is safe Please come back for a follow-up appointment in 3 weeks.

## 2017-03-21 DIAGNOSIS — E0590 Thyrotoxicosis, unspecified without thyrotoxic crisis or storm: Secondary | ICD-10-CM | POA: Insufficient documentation

## 2017-04-13 ENCOUNTER — Ambulatory Visit: Payer: Self-pay | Admitting: Endocrinology

## 2021-09-08 ENCOUNTER — Ambulatory Visit: Payer: Self-pay | Admitting: Cardiology

## 2021-10-06 ENCOUNTER — Encounter: Payer: Self-pay | Admitting: Cardiology

## 2021-10-06 ENCOUNTER — Ambulatory Visit (INDEPENDENT_AMBULATORY_CARE_PROVIDER_SITE_OTHER): Payer: 59 | Admitting: Cardiology

## 2021-10-06 VITALS — BP 134/82 | HR 63 | Ht 63.0 in | Wt 165.0 lb

## 2021-10-06 DIAGNOSIS — I272 Pulmonary hypertension, unspecified: Secondary | ICD-10-CM | POA: Diagnosis not present

## 2021-10-06 DIAGNOSIS — Z79899 Other long term (current) drug therapy: Secondary | ICD-10-CM

## 2021-10-06 DIAGNOSIS — I340 Nonrheumatic mitral (valve) insufficiency: Secondary | ICD-10-CM

## 2021-10-06 DIAGNOSIS — I10 Essential (primary) hypertension: Secondary | ICD-10-CM

## 2021-10-06 DIAGNOSIS — F17200 Nicotine dependence, unspecified, uncomplicated: Secondary | ICD-10-CM | POA: Diagnosis not present

## 2021-10-06 DIAGNOSIS — R0989 Other specified symptoms and signs involving the circulatory and respiratory systems: Secondary | ICD-10-CM

## 2021-10-06 MED ORDER — METOPROLOL SUCCINATE ER 50 MG PO TB24: 50.0000 mg | ORAL_TABLET | Freq: Every day | ORAL | 3 refills | Status: AC

## 2021-10-06 MED ORDER — ENTRESTO 24-26 MG PO TABS: 1.0000 | ORAL_TABLET | Freq: Two times a day (BID) | ORAL | 3 refills | Status: AC

## 2021-10-06 NOTE — Progress Notes (Signed)
?Cardiology Office Note:   ? ?Date:  10/08/2021  ? ?ID:  [name], DOB [birth year], MRN [id number] ? ?PCP:  Patient, No Pcp Per (Inactive)  ?Cardiologist:  Berniece Salines, DO  ?Electrophysiologist:  None  ? ?Referring MD: Sherin Quarry, PA-C  ? ?" I am ok, need a new cardiology"  ? ?History of Present Illness:   ? ?[name] is a 51 y.o. 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 ? ?There is also concern on echocardiogram for amyloidosis. ? ?She is here today to establish cardiac care.  She is transition her care from [facility] to [facility]. ? ?She offers no complaint of chest pain.  She has had some shortness of breath.   ? ? ?Past Medical History:  ?Diagnosis Date  ? Anemia   ? Fibroid   ? H/O menorrhagia   ? Headache   ? Ocular injury   ? ? ?Past Surgical History:  ?Procedure Laterality Date  ? WISDOM TOOTH EXTRACTION    ? ? ?Current Medications: ?Current Meds  ?Medication Sig  ? aspirin 81 MG tablet Take 81 mg by mouth daily.  ? atorvastatin (LIPITOR) 40 MG tablet atorvastatin 40 mg tablet ? Take 1 tablet every day by oral route.  ? diclofenac Sodium (VOLTAREN) 1 % GEL Apply topically as needed.  ? furosemide (LASIX) 20 MG tablet Take 20 mg by mouth daily. 2 tablets by mouth daily ('100mg'$ )  ? methimazole (TAPAZOLE) 5 MG tablet Take 2.5 mg by mouth 3 (three) times daily. Take 1/2 tablet by mouth daily  ? sacubitril-valsartan (ENTRESTO) 24-26 MG Take 1 tablet by mouth 2 (two) times daily.  ? [DISCONTINUED] losartan (COZAAR) 25 MG tablet Take 25 mg by mouth daily.  ? [DISCONTINUED]  methimazole (TAPAZOLE) 10 MG tablet Take 4 tablets (40 mg total) by mouth 2 (two) times daily. (Patient taking differently: Take 0.5 mg by mouth daily.)  ? [DISCONTINUED] metoprolol succinate (TOPROL-XL) 50 MG 24 hr tablet Take 50 mg by mouth daily.  ?  ? ?Allergies:   Plavix [clopidogrel bisulfate]  ? ?Social History  ? ?Socioeconomic History  ? Marital status: Single  ?  Spouse name: Not on file  ? Number of children: Not on file  ? Years of education: Not on file  ? Highest education level: Not on file  ?Occupational History  ? Not on file  ?Tobacco Use  ? Smoking status: Every Day  ?  Packs/day: 0.25  ?  Types: Cigarettes  ?  Start date: 02/27/1995  ? Smokeless tobacco: Never  ?Substance and Sexual Activity  ? Alcohol use: No  ?  Alcohol/week: 0.0 standard drinks  ?  Comment: rarely on social occasions   ? Drug use: Yes  ?  Types: Marijuana  ?  Comment: Last marijuana 03/07/17  ? Sexual activity: Yes  ?Other Topics Concern  ? Not on file  ?Social History Narrative  ? Not on file  ? ?Social Determinants of Health  ? ?Financial Resource Strain: Not on file  ?Food Insecurity: Not on file  ?Transportation Needs: Not on file  ?Physical Activity: Not on file  ?  Stress: Not on file  ?Social Connections: Not on file  ?  ? ?Family History: ?The patient's family history includes Cancer in her maternal aunt, maternal grandfather, and mother; Diabetes in her maternal grandmother and mother. ? ?ROS:   ?Review of Systems  ?Constitution: Negative for decreased appetite, fever and weight gain.  ?HENT: Negative for congestion, ear discharge, hoarse voice and sore throat.   ?Eyes: Negative for discharge, redness, vision loss in right eye and visual halos.  ?Cardiovascular: Negative for chest pain, dyspnea on exertion, leg swelling, orthopnea and palpitations.  ?Respiratory: Negative for cough, hemoptysis, shortness of breath and snoring.   ?Endocrine: Negative for heat intolerance and polyphagia.  ?Hematologic/Lymphatic: Negative  for bleeding problem. Does not bruise/bleed easily.  ?Skin: Negative for flushing, nail changes, rash and suspicious lesions.  ?Musculoskeletal: Negative for arthritis, joint pain, muscle cramps, myalgias, neck pain and stiffness.  ?Gastrointestinal: Negative for abdominal pain, bowel incontinence, diarrhea and excessive appetite.  ?Genitourinary: Negative for decreased libido, genital sores and incomplete emptying.  ?Neurological: Negative for brief paralysis, focal weakness, headaches and loss of balance.  ?Psychiatric/Behavioral: Negative for altered mental status, depression and suicidal ideas.  ?Allergic/Immunologic: Negative for HIV exposure and persistent infections.  ? ? ?EKGs/Labs/Other Studies Reviewed:   ? ?The following studies were reviewed today: ? ? ?EKG:  The ekg ordered today demonstrates sinus rhythm, heart rate 63 bpm with underlying left atrial lodgment. ? ?TTE 05/28/2020 ?-  ?SUMMARY  ?The left ventricle is mildly dilated. There is normal left ventricular  ?wall thickness. LV Global L Strain =-12.4%. LV ejection fraction = 35-  ?40%.Left ventricular systolic function is moderately reduced.  ?Apical wall motion is better preserved than basal and mid segments.  ?Strain pattern is also suggestive of amyloidosis. Consider workup for  ?cardiac amyloidosis with CMR.  ? ?The right ventricle is normal in size and function.  ?Mild bi-atrial dilitation.  ? ?There is moderate mitral regurgitation. Etiology of mitral  ?regurgitation is functional .  ?There is mild tricuspid regurgitation.  ?Mild pulmonary hypertension. Estimated right ventricular systolic  ?pressure is 41 mmHg.  ?IVC size was mildly dilated.  ? ?There is no pericardial effusion.  ?Compared with prior study on 12/30/8561, LV systolic function has  ?marginally improved with significant improvement in degree of right  ?sided chamber dilation. Moderate mitral regurgitation is noted.  ? ?-  ?FINDINGS:  ?LEFT VENTRICLE  ?The left ventricle is  mildly dilated. There is normal left ventricular  ?wall thickness. Left ventricular systolic function is moderately  ?reduced. LV ejection fraction = 35-40%. LV Global L Strain =-12.4%.  ?Left ventricular filling pattern is indeterminate. Apical wall motion  ?is better preserved than basal and mid segments. Strain pattern is  ?also suggestive of amyloidosis. Consider workup for cardiac  ?amyloidosis with CMR.  ? ?Recent Labs: ?10/06/2021: BUN 12; Creatinine, Ser 0.70; Hemoglobin 15.0; Magnesium 2.2; Platelets 258; Potassium 4.4; Sodium 141  ?Recent Lipid Panel ?   ?Component Value Date/Time  ? CHOL  07/16/2008 1615  ?  116        ?ATP III CLASSIFICATION: ? <200     mg/dL   Desirable ? 200-239  mg/dL   Borderline High ? >=240    mg/dL   High ?        ? TRIG 47 07/16/2008 1615  ? HDL 39 (L) 07/16/2008 1615  ? CHOLHDL 3.0 07/16/2008 1615  ? VLDL 9 07/16/2008 1615  ? [place]  07/16/2008 1615  ?  68        ?  Total Cholesterol/HDL:CHD Risk ?Coronary Heart Disease Risk Table ?                    Men   Women ? 1/2 Average Risk   3.4   3.3 ? Average Risk       5.0   4.4 ? 2 X Average Risk   9.6   7.1 ? 3 X Average Risk  23.4   11.0 ?       ?Use the calculated Patient Ratio ?above and the CHD Risk Table ?to determine the patient's CHD Risk. ?       ?ATP III CLASSIFICATION (LDL): ? <100     mg/dL   Optimal ? 100-129  mg/dL   Near or Above ?                   Optimal ? 130-159  mg/dL   Borderline ? 160-189  mg/dL   High ? >190     mg/dL   Very High  ? ? ?Physical Exam:   ? ?VS:  BP 134/82 (BP Location: Right Arm, Patient Position: Sitting, Cuff Size: Normal)   Pulse 63   Ht '5\' 3"'$  (1.6 m)   Wt 165 lb (74.8 kg)   SpO2 95%   BMI 29.23 kg/m?    ? ?Wt Readings from Last 3 Encounters:  ?10/06/21 165 lb (74.8 kg)  ?03/19/17 125 lb 9.6 oz (57 kg)  ?03/09/17 121 lb (54.9 kg)  ?  ? ?GEN: Well nourished, well developed in no acute distress ?HEENT: Normal ?NECK: No JVD; No carotid bruits ?LYMPHATICS: No lymphadenopathy ?CARDIAC: S1S2  noted,RRR, no murmurs, rubs, gallops ?RESPIRATORY:  Clear to auscultation without rales, wheezing or rhonchi  ?ABDOMEN: Soft, non-tender, non-distended, +bowel sounds, no guarding. ?EXTREMITIES: No edema, No cyanos

## 2021-10-06 NOTE — Patient Instructions (Addendum)
Medication Instructions:  ?Your physician has recommended you make the following change in your medication:  ?STOP: Losartan  ?START: Entresto 24-26 mg twice daily ?*If you need a refill on your cardiac medications before your next appointment, please call your pharmacy* ? ? ?Lab Work: ?Your physician recommends that you return for lab work in:  ?TODAY: BMET, [place], CBC ?If you have labs (blood work) drawn today and your tests are completely normal, you will receive your results only by: ?MyChart Message (if you have MyChart) OR ?A paper copy in the mail ?If you have any lab test that is abnormal or we need to change your treatment, we will call you to review the results. ? ? ?Testing/Procedures: ?Your physician has requested that you have a cardiac MRI. Cardiac MRI uses a computer to create images of your heart as its beating, producing both still and moving pictures of your heart and major blood vessels. For further information please visit [email or web address] Please follow the instruction sheet given to you today for more information. ? ? ?Follow-Up: ?At [facility], you and your health needs are our priority.  As part of our continuing mission to provide you with exceptional heart care, we have created designated Provider Care Teams.  These Care Teams include your primary Cardiologist (physician) and Advanced Practice Providers (APPs -  Physician Assistants and Nurse Practitioners) who all work together to provide you with the care you need, when you need it. ? ?We recommend signing up for the patient portal called "MyChart".  Sign up information is provided on this After Visit Summary.  MyChart is used to connect with patients for Virtual Visits (Telemedicine).  Patients are able to view lab/test results, encounter notes, upcoming appointments, etc.  Non-urgent messages can be sent to your provider as well.   ?To learn more about what you can do with MyChart, go to [email or web address].   ? ?Your next  appointment:   ?12 week(s) ? ?The format for your next appointment:   ?In Person ? ?Provider:   ?Berniece Salines, DO   ? ? ?Other Instructions ? ? ?Important Information About Sugar ? ? ? ? ?  ?

## 2021-10-07 LAB — CBC WITH DIFFERENTIAL/PLATELET
Basophils Absolute: 0 10*3/uL (ref 0.0–0.2)
Basos: 1 %
EOS (ABSOLUTE): 0.1 10*3/uL (ref 0.0–0.4)
Eos: 4 %
Hematocrit: 46.1 % (ref 34.0–46.6)
Hemoglobin: 15 g/dL (ref 11.1–15.9)
Immature Grans (Abs): 0 10*3/uL (ref 0.0–0.1)
Immature Granulocytes: 0 %
Lymphocytes Absolute: 1.9 10*3/uL (ref 0.7–3.1)
Lymphs: 48 %
MCH: 26.9 pg (ref 26.6–33.0)
MCHC: 32.5 g/dL (ref 31.5–35.7)
MCV: 83 fL (ref 79–97)
Monocytes Absolute: 0.5 10*3/uL (ref 0.1–0.9)
Monocytes: 13 %
Neutrophils Absolute: 1.3 10*3/uL — ABNORMAL LOW (ref 1.4–7.0)
Neutrophils: 34 %
Platelets: 258 10*3/uL (ref 150–450)
RBC: 5.57 x10E6/uL — ABNORMAL HIGH (ref 3.77–5.28)
RDW: 13.3 % (ref 11.7–15.4)
WBC: 4 10*3/uL (ref 3.4–10.8)

## 2021-10-07 LAB — BASIC METABOLIC PANEL
BUN/Creatinine Ratio: 17 (ref 9–23)
BUN: 12 mg/dL (ref 6–24)
CO2: 25 mmol/L (ref 20–29)
Calcium: 9.3 mg/dL (ref 8.7–10.2)
Chloride: 103 mmol/L (ref 96–106)
Creatinine, Ser: 0.7 mg/dL (ref 0.57–1.00)
Glucose: 77 mg/dL (ref 70–99)
Potassium: 4.4 mmol/L (ref 3.5–5.2)
Sodium: 141 mmol/L (ref 134–144)
eGFR: 105 mL/min/{1.73_m2} (ref >59)

## 2021-10-07 LAB — MAGNESIUM: Magnesium: 2.2 mg/dL (ref 1.6–2.3)

## 2021-10-29 ENCOUNTER — Institutional Professional Consult (permissible substitution): Payer: 59 | Admitting: Pulmonary Disease

## 2021-11-11 ENCOUNTER — Telehealth (HOSPITAL_COMMUNITY): Payer: Self-pay | Admitting: *Deleted

## 2021-11-11 NOTE — Telephone Encounter (Signed)
Reaching out to patient to offer assistance regarding upcoming cardiac imaging study; pt verbalizes understanding of appt date/time, parking situation and where to check in, and verified current allergies; name and call back number provided for further questions should they arise  Janice Clement RN [facility] [phone] office [phone] cell  Reports a retainer in mouth but denies claustrophobia.

## 2021-11-12 ENCOUNTER — Ambulatory Visit (HOSPITAL_COMMUNITY)
Admission: RE | Admit: 2021-11-12 | Discharge: 2021-11-12 | Disposition: A | Discharge status: Home / Self Care | Payer: 59 | Source: Ambulatory Visit | Attending: Cardiology | Admitting: Cardiology

## 2021-11-12 DIAGNOSIS — R0989 Other specified symptoms and signs involving the circulatory and respiratory systems: Secondary | ICD-10-CM | POA: Diagnosis not present

## 2021-11-12 MED ORDER — GADOBUTROL 1 MMOL/ML IV SOLN
8.0000 mL | Freq: Once | INTRAVENOUS | Status: AC | PRN
  Administered 2021-11-12: 8 mL via INTRAVENOUS

## 2021-11-15 ENCOUNTER — Other Ambulatory Visit: Payer: Self-pay | Admitting: Cardiology

## 2021-11-18 ENCOUNTER — Other Ambulatory Visit: Payer: Self-pay

## 2021-11-18 DIAGNOSIS — E0590 Thyrotoxicosis, unspecified without thyrotoxic crisis or storm: Secondary | ICD-10-CM

## 2021-11-18 DIAGNOSIS — Z7689 Persons encountering health services in other specified circumstances: Secondary | ICD-10-CM

## 2021-11-18 MED ORDER — ATORVASTATIN CALCIUM 40 MG PO TABS: 40.0000 mg | ORAL_TABLET | Freq: Every day | ORAL | 3 refills | Status: AC

## 2021-11-27 ENCOUNTER — Other Ambulatory Visit: Payer: Self-pay

## 2022-01-05 ENCOUNTER — Ambulatory Visit: Payer: 59 | Admitting: Cardiology

## 2022-03-09 ENCOUNTER — Ambulatory Visit: Payer: 59 | Attending: Cardiology | Admitting: Cardiology

## 2022-09-29 ENCOUNTER — Other Ambulatory Visit: Payer: Self-pay | Admitting: Family Medicine

## 2022-09-29 DIAGNOSIS — Z1231 Encounter for screening mammogram for malignant neoplasm of breast: Secondary | ICD-10-CM

## 2022-12-15 IMAGING — MR MR CARD MORPHOLOGY WO/W CM
45 of 48 series · 45 of 48 positions shown · IV contrast (gadavist)
Comparison: none

CLINICAL DATA: 50F with HFrEF. Echo 05/28/20 showed EF 35-40%,
strain pattern suggestive of amyloidosis. Cath 10/25/20 showed
minimal nonobstructive CAD

EXAM:
CARDIAC MRI
TECHNIQUE: The patient was scanned on a 1.5 Tesla Siemens magnet. A dedicated
cardiac coil was used. Functional imaging was done using Fiesta
sequences. [DATE], and 4 chamber views were done to assess for RWMA's.
Modified Yosvan rule using a short axis stack was used to
calculate an ejection fraction on a dedicated work station using
Circle software. The patient received 14 cc of Gadavist. After 10
minutes inversion recovery sequences were used to assess for
infiltration and scar tissue.
CONTRAST:  14 cc  of Gadavist

[Series 4: [id number]_bh · axial · 8.0mm · 1.41mm/px · 1 of 16 slices shown]
[im 1/16]
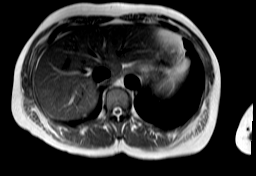

[Series 8: bSSFP · oblique · 8.0mm · 1.61mm/px · 1 of 25 slices shown (1 of 20)]
[im 1/25]
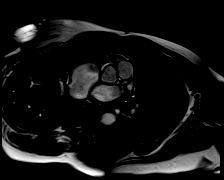

[Series 9: bSSFP · oblique · 8.0mm · 1.61mm/px · 1 of 25 slices shown (2 of 20)]
[im 1/25]
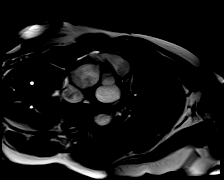

[Series 10: bSSFP · oblique · 8.0mm · 1.61mm/px · 1 of 25 slices shown (3 of 20)]
[im 1/25]
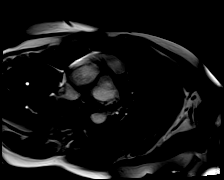

[Series 11: bSSFP · oblique · 8.0mm · 1.61mm/px · 1 of 25 slices shown (4 of 20)]
[im 1/25]
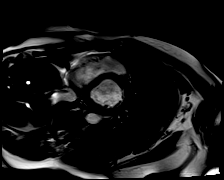

[Series 12: bSSFP · oblique · 8.0mm · 1.61mm/px · 1 of 25 slices shown (5 of 20)]
[im 1/25]
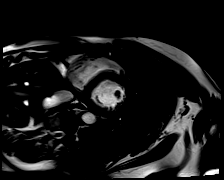

[Series 13: bSSFP · oblique · 8.0mm · 1.61mm/px · 1 of 25 slices shown (6 of 20)]
[im 1/25]
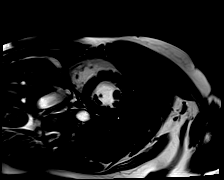

[Series 14: bSSFP · oblique · 8.0mm · 1.61mm/px · 1 of 25 slices shown (7 of 20)]
[im 1/25]
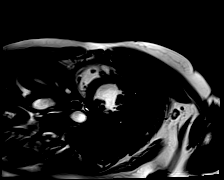

[Series 15: bSSFP · oblique · 8.0mm · 1.61mm/px · 1 of 25 slices shown (8 of 20)]
[im 1/25]
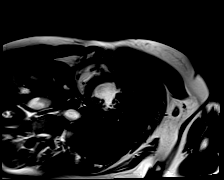

[Series 16: bSSFP · oblique · 8.0mm · 1.61mm/px · 1 of 25 slices shown (9 of 20)]
[im 1/25]
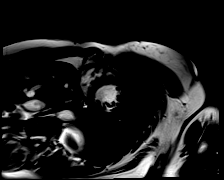

[Series 17: bSSFP · oblique · 8.0mm · 1.61mm/px · 1 of 25 slices shown (10 of 20)]
[im 1/25]
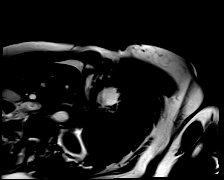

[Series 18: bSSFP · oblique · 8.0mm · 1.61mm/px · 1 of 25 slices shown (11 of 20)]
[im 1/25]
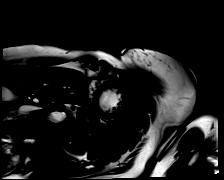

[Series 19: bSSFP · oblique · 8.0mm · 1.61mm/px · 1 of 25 slices shown (12 of 20)]
[im 1/25]
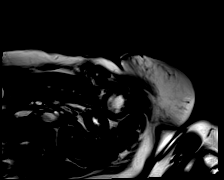

[Series 20: bSSFP · oblique · 8.0mm · 1.61mm/px · 1 of 25 slices shown (13 of 20)]
[im 1/25]
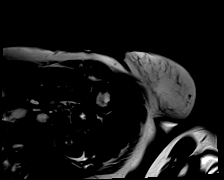

[Series 21: bSSFP · oblique · 8.0mm · 1.61mm/px · 1 of 25 slices shown (14 of 20)]
[im 1/25]
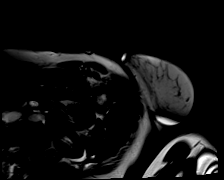

[Series 22: bSSFP · oblique · 8.0mm · 1.61mm/px · 1 of 25 slices shown (15 of 20)]
[im 1/25]
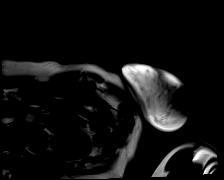

[Series 23: bSSFP · oblique · 8.0mm · 1.61mm/px · 1 of 25 slices shown (16 of 20)]
[im 1/25]
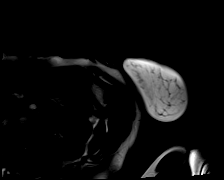

[Series 24: bSSFP · oblique · 8.0mm · 1.61mm/px · 1 of 25 slices shown (17 of 20)]
[im 1/25]
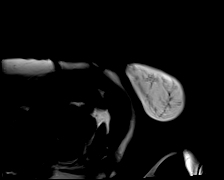

[Series 25: bSSFP · sagittal · 6.0mm · 1.41mm/px · 1 of 25 slices shown (18 of 20)]
[im 1/25]
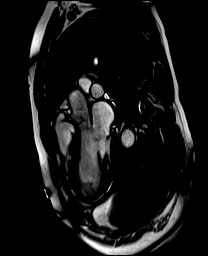

[Series 26: bSSFP · oblique · 6.0mm · 1.41mm/px · 1 of 25 slices shown (19 of 20)]
[im 1/25]
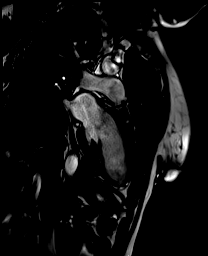

[Series 27: bSSFP · oblique · 6.0mm · 1.41mm/px · 1 of 25 slices shown (20 of 20)]
[im 1/25]
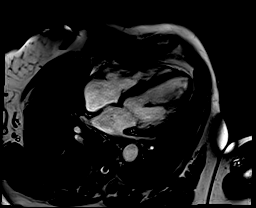

[Series 28: (id)_long_t1 · oblique · 8.0mm · 1.56mm/px · 1 of 24 slices shown]
[im 1/24]
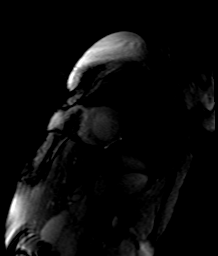

[Series 29: (id)_long_t1_moco · oblique · 8.0mm · 1.56mm/px · 1 of 24 slices shown]
[im 1/24]
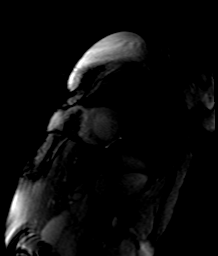

[Series 32: (id)_trufi · oblique · 8.0mm · 2.08mm/px · 1 of 9 slices shown]
[im 1/9]
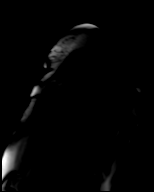

[Series 33: (id)_trufi_moco · oblique · 8.0mm · 2.08mm/px · 1 of 9 slices shown]
[im 1/9]
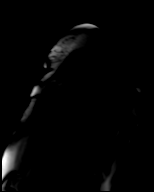

[Series 37: cine_trufi_short axis_cs_2_shot · oblique · 8.0mm · 1.48mm/px · 1 of 25 slices shown (1 of 20)]
[im 1/25]
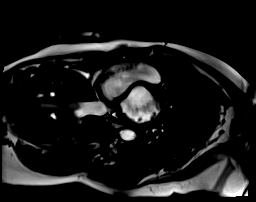

[Series 37: cine_trufi_short axis_cs_2_shot · oblique · 8.0mm · 1.48mm/px · 1 of 25 slices shown (2 of 20)]
[im 1/25]
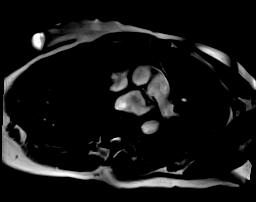

[Series 37: cine_trufi_short axis_cs_2_shot · oblique · 8.0mm · 1.48mm/px · 1 of 25 slices shown (3 of 20)]
[im 1/25]
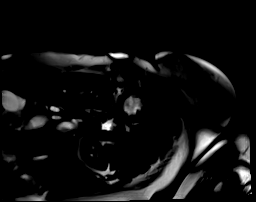

[Series 37: cine_trufi_short axis_cs_2_shot · oblique · 8.0mm · 1.48mm/px · 1 of 25 slices shown (4 of 20)]
[im 1/25]
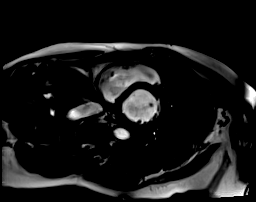

[Series 37: cine_trufi_short axis_cs_2_shot · oblique · 8.0mm · 1.48mm/px · 1 of 25 slices shown (5 of 20)]
[im 1/25]
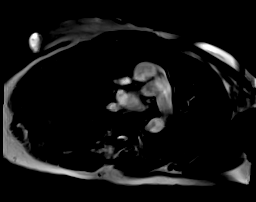

[Series 37: cine_trufi_short axis_cs_2_shot · oblique · 8.0mm · 1.48mm/px · 1 of 25 slices shown (6 of 20)]
[im 1/25]
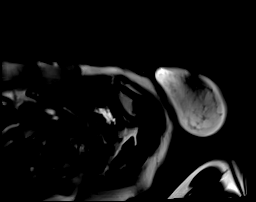

[Series 37: cine_trufi_short axis_cs_2_shot · oblique · 8.0mm · 1.48mm/px · 1 of 25 slices shown (7 of 20)]
[im 1/25]
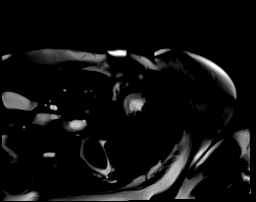

[Series 37: cine_trufi_short axis_cs_2_shot · oblique · 8.0mm · 1.48mm/px · 1 of 25 slices shown (8 of 20)]
[im 1/25]
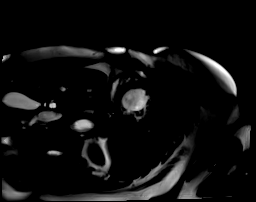

[Series 37: cine_trufi_short axis_cs_2_shot · oblique · 8.0mm · 1.48mm/px · 1 of 25 slices shown (9 of 20)]
[im 1/25]
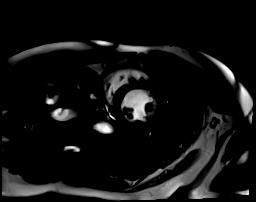

[Series 37: cine_trufi_short axis_cs_2_shot · oblique · 8.0mm · 1.48mm/px · 1 of 25 slices shown (10 of 20)]
[im 1/25]
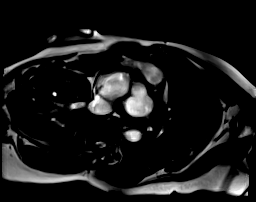

[Series 37: cine_trufi_short axis_cs_2_shot · oblique · 8.0mm · 1.48mm/px · 1 of 25 slices shown (11 of 20)]
[im 1/25]
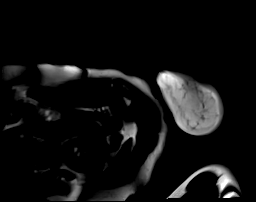

[Series 37: cine_trufi_short axis_cs_2_shot · oblique · 8.0mm · 1.48mm/px · 1 of 25 slices shown (12 of 20)]
[im 1/25]
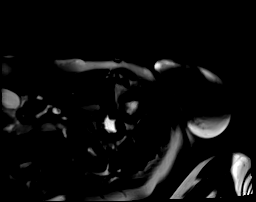

[Series 37: cine_trufi_short axis_cs_2_shot · oblique · 8.0mm · 1.48mm/px · 1 of 25 slices shown (13 of 20)]
[im 1/25]
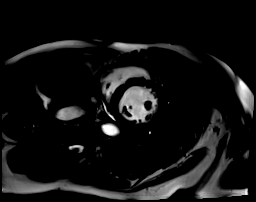

[Series 37: cine_trufi_short axis_cs_2_shot · oblique · 8.0mm · 1.48mm/px · 1 of 25 slices shown (14 of 20)]
[im 1/25]
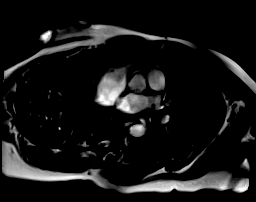

[Series 37: cine_trufi_short axis_cs_2_shot · oblique · 8.0mm · 1.48mm/px · 1 of 25 slices shown (15 of 20)]
[im 1/25]
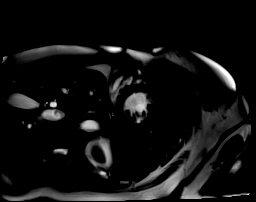

[Series 37: cine_trufi_short axis_cs_2_shot · oblique · 8.0mm · 1.48mm/px · 1 of 25 slices shown (16 of 20)]
[im 1/25]
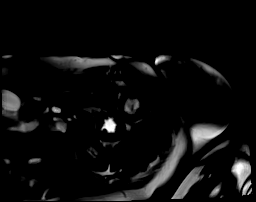

[Series 37: cine_trufi_short axis_cs_2_shot · oblique · 8.0mm · 1.48mm/px · 1 of 25 slices shown (17 of 20)]
[im 1/25]
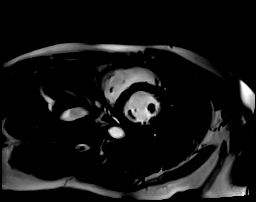

[Series 37: cine_trufi_short axis_cs_2_shot · oblique · 8.0mm · 1.48mm/px · 1 of 25 slices shown (18 of 20)]
[im 1/25]
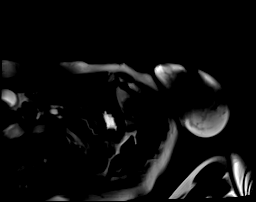

[Series 37: cine_trufi_short axis_cs_2_shot · oblique · 8.0mm · 1.48mm/px · 1 of 25 slices shown (19 of 20)]
[im 1/25]
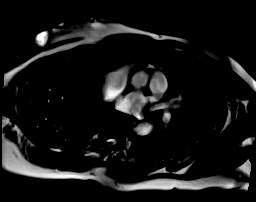

[Series 37: cine_trufi_short axis_cs_2_shot · oblique · 8.0mm · 1.48mm/px · 1 of 25 slices shown (20 of 20)]
[im 1/25]
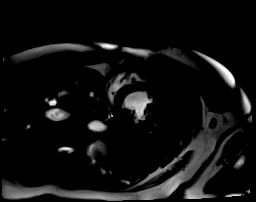

[45 of 48 positions shown; findings below may reference images not displayed]

FINDINGS: Left ventricle:

-Normal size

-Mild hypertrophy

-Mild systolic dysfunction

-Mild nonspecific elevation in ECV (29%)

-Basal septal midwall LGE

-RV insertion site LGE

LV EF:  41% (Normal 56-78%)

Absolute volumes:

LV EDV: 158mL (Normal 52-141 mL)

LV ESV: 93mL (Normal 13-51 mL)

LV SV: 64mL (Normal 33-97 mL)

CO: 3.3L/min (Normal 2.7-6.0 L/min)

Indexed volumes:

LV EDV: 86mL/sq-m (Normal 41-81 mL/sq-m)

LV ESV: 51mL/sq-m (Normal 12-21 mL/sq-m)

LV SV: 35mL/sq-m (Normal 26-56 mL/sq-m)

CI: 1.8L/min/sq-m (Normal 1.8-3.8 L/min/sq-m)

Right ventricle: Normal size and systolic function

RV EF: 48% (Normal 47-80%)

Absolute volumes:

RV EDV: 132mL (Normal 58-154 mL)

RV ESV: 69mL (Normal 12-68 mL)

RV SV: 63mL (Normal 35-98 mL)

CO: 3.3L/min (Normal 2.7-6 L/min)

Indexed volumes:

RV EDV: 72mL/sq-m (Normal 48-87 mL/sq-m)

RV ESV: 38mL/sq-m (Normal 11-28 mL/sq-m)

RV SV: 35mL/sq-m (Normal 27-57 mL/sq-m)

CI: 1.8L/min/sq-m (Normal 1.8-3.8 L/min/sq-m)

Left atrium: Mild enlargement

Right atrium: Mild enlargement

Mitral valve: Trivial regurgitation

Aortic valve: Tricuspid.  No regurgitation

Tricuspid valve: Trivial regurgitation

Pulmonic valve: No regurgitation

Aorta: Normal proximal ascending aorta

Pericardium: Normal
IMPRESSION: 1.  No evidence of cardiac amyloidosis

2. Normal LV size, mild hypertrophy, and mild systolic dysfunction
(EF 41%)

3.  Normal RV size and systolic function (EF 48%)

4. Basal septal midwall LGE, which is a scar pattern seen in
nonischemic cardiomyopathies and associated with a worse prognosis

5. RV insertion site LGE, which is a nonspecific scar pattern seen
in setting of elevated pulmonary pressures

## 2024-04-05 ENCOUNTER — Other Ambulatory Visit: Payer: Self-pay | Admitting: Family Medicine

## 2024-04-05 DIAGNOSIS — Z1231 Encounter for screening mammogram for malignant neoplasm of breast: Secondary | ICD-10-CM

## 2024-06-12 ENCOUNTER — Encounter: Admitting: Obstetrics & Gynecology
